# Patient Record
Sex: Female | Born: 1955
Health system: Southern US, Community
[De-identification: ages and names within clinical notes are randomized; demographics above are authoritative.]

## PROBLEM LIST (undated history)

## (undated) DIAGNOSIS — R768 Other specified abnormal immunological findings in serum: Secondary | ICD-10-CM

## (undated) DIAGNOSIS — D649 Anemia, unspecified: Secondary | ICD-10-CM

## (undated) DIAGNOSIS — H348122 Central retinal vein occlusion, left eye, stable: Secondary | ICD-10-CM

## (undated) DIAGNOSIS — I8290 Acute embolism and thrombosis of unspecified vein: Secondary | ICD-10-CM

## (undated) DIAGNOSIS — R7689 Other specified abnormal immunological findings in serum: Secondary | ICD-10-CM

## (undated) HISTORY — DX: Other specified abnormal immunological findings in serum: R76.8

## (undated) HISTORY — DX: Other specified abnormal immunological findings in serum: R76.89

## (undated) HISTORY — PX: COLONOSCOPY: SHX174

## (undated) HISTORY — DX: Central retinal vein occlusion, left eye, stable: H34.8122

## (undated) HISTORY — DX: Anemia, unspecified: D64.9

---
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
## 1988-03-14 DIAGNOSIS — H348122 Central retinal vein occlusion, left eye, stable: Secondary | ICD-10-CM

## 1988-03-14 HISTORY — DX: Central retinal vein occlusion, left eye, stable: H34.8122

## 1999-01-13 ENCOUNTER — Other Ambulatory Visit: Admission: RE | Admit: 1999-01-13 | Discharge: 1999-01-13 | Payer: Self-pay | Admitting: Gynecology

## 2000-01-31 ENCOUNTER — Other Ambulatory Visit: Admission: RE | Admit: 2000-01-31 | Discharge: 2000-01-31 | Payer: Self-pay | Admitting: Gynecology

## 2001-01-31 ENCOUNTER — Other Ambulatory Visit: Admission: RE | Admit: 2001-01-31 | Discharge: 2001-01-31 | Payer: Self-pay | Admitting: Gynecology

## 2002-03-14 HISTORY — PX: TUBAL LIGATION: SHX77

## 2002-04-24 ENCOUNTER — Other Ambulatory Visit: Admission: RE | Admit: 2002-04-24 | Discharge: 2002-04-24 | Payer: Self-pay | Admitting: Gynecology

## 2002-05-23 ENCOUNTER — Ambulatory Visit (HOSPITAL_COMMUNITY): Admission: RE | Admit: 2002-05-23 | Discharge: 2002-05-23 | Payer: Self-pay | Admitting: Gynecology

## 2002-05-23 ENCOUNTER — Encounter (INDEPENDENT_AMBULATORY_CARE_PROVIDER_SITE_OTHER): Payer: Self-pay

## 2003-03-15 HISTORY — PX: BLADDER SUSPENSION: SHX72

## 2003-03-15 HISTORY — PX: TOTAL VAGINAL HYSTERECTOMY: SHX2548

## 2003-03-25 ENCOUNTER — Encounter (INDEPENDENT_AMBULATORY_CARE_PROVIDER_SITE_OTHER): Payer: Self-pay | Admitting: Specialist

## 2003-03-25 ENCOUNTER — Observation Stay (HOSPITAL_COMMUNITY): Admission: RE | Admit: 2003-03-25 | Discharge: 2003-03-26 | Payer: Self-pay | Admitting: Gynecology

## 2005-05-12 ENCOUNTER — Other Ambulatory Visit: Admission: RE | Admit: 2005-05-12 | Discharge: 2005-05-12 | Payer: Self-pay | Admitting: Gynecology

## 2006-10-19 ENCOUNTER — Other Ambulatory Visit: Admission: RE | Admit: 2006-10-19 | Discharge: 2006-10-19 | Payer: Self-pay | Admitting: Gynecology

## 2007-11-07 ENCOUNTER — Encounter: Admission: RE | Admit: 2007-11-07 | Discharge: 2007-11-07 | Payer: Self-pay | Admitting: Internal Medicine

## 2008-03-19 ENCOUNTER — Ambulatory Visit: Payer: Self-pay | Admitting: Women's Health

## 2008-03-19 ENCOUNTER — Encounter: Payer: Self-pay | Admitting: Women's Health

## 2008-03-19 ENCOUNTER — Other Ambulatory Visit: Admission: RE | Admit: 2008-03-19 | Discharge: 2008-03-19 | Payer: Self-pay | Admitting: Gynecology

## 2008-04-23 ENCOUNTER — Ambulatory Visit: Payer: Self-pay | Admitting: Internal Medicine

## 2008-05-07 ENCOUNTER — Ambulatory Visit: Payer: Self-pay | Admitting: Internal Medicine

## 2009-02-24 ENCOUNTER — Ambulatory Visit: Payer: Self-pay | Admitting: Gynecology

## 2009-03-25 ENCOUNTER — Other Ambulatory Visit: Admission: RE | Admit: 2009-03-25 | Discharge: 2009-03-25 | Payer: Self-pay | Admitting: Gynecology

## 2009-03-25 ENCOUNTER — Ambulatory Visit: Payer: Self-pay | Admitting: Women's Health

## 2009-12-16 IMAGING — CR DG PELVIS 1-2V
1 series · 1 of 1 positions shown · non-contrast
Comparison: None

CLINICAL DATA: Right-sided pelvic pain.  Right hip pain.  No
injury.

PELVIS - 1-2 VIEW

[t pelvis a.p.]
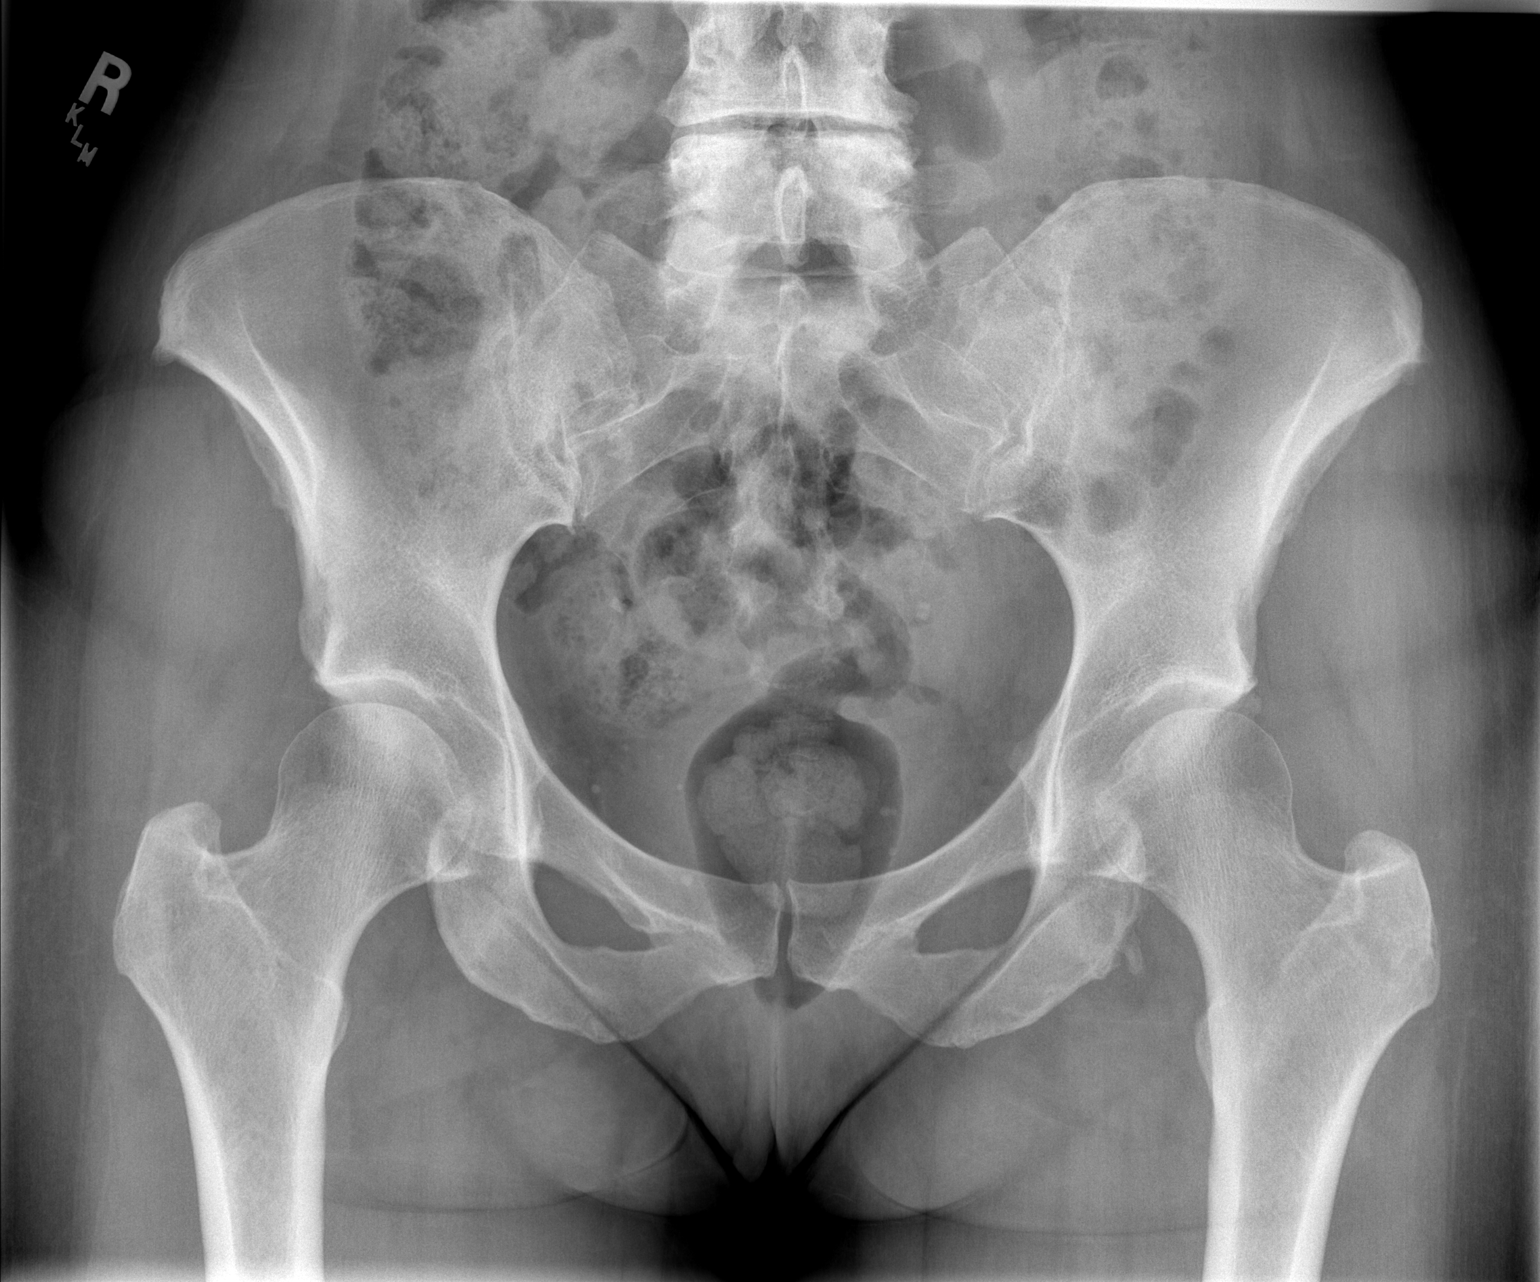

[1 of 1 positions shown; findings below may reference images not displayed]

FINDINGS: Moderate degenerative disc disease involves L4-L5.  The
sacroiliac joints are symmetric.  Both femoral heads are located.
Joint spaces are maintained.  Nonobstructive visualized bowel gas
pattern.  Phleboliths in the pelvis.
IMPRESSION: 1.  No acute findings about the pelvis.
2.  L4-L5 degenerative disc disease is moderate..

## 2010-05-19 ENCOUNTER — Encounter (INDEPENDENT_AMBULATORY_CARE_PROVIDER_SITE_OTHER): Payer: 59 | Admitting: Women's Health

## 2010-05-19 ENCOUNTER — Other Ambulatory Visit (HOSPITAL_COMMUNITY)
Admission: RE | Admit: 2010-05-19 | Discharge: 2010-05-19 | Disposition: A | Discharge status: Home / Self Care | Payer: 59 | Source: Ambulatory Visit | Attending: Gynecology | Admitting: Gynecology

## 2010-05-19 ENCOUNTER — Encounter: Payer: Self-pay | Admitting: Women's Health

## 2010-05-19 ENCOUNTER — Other Ambulatory Visit: Payer: Self-pay | Admitting: Women's Health

## 2010-05-19 DIAGNOSIS — Z124 Encounter for screening for malignant neoplasm of cervix: Secondary | ICD-10-CM | POA: Insufficient documentation

## 2010-05-19 DIAGNOSIS — R809 Proteinuria, unspecified: Secondary | ICD-10-CM

## 2010-05-19 DIAGNOSIS — Z13220 Encounter for screening for lipoid disorders: Secondary | ICD-10-CM

## 2010-05-19 DIAGNOSIS — Z833 Family history of diabetes mellitus: Secondary | ICD-10-CM

## 2010-05-19 DIAGNOSIS — Z01419 Encounter for gynecological examination (general) (routine) without abnormal findings: Secondary | ICD-10-CM

## 2010-05-19 LAB — HM PAP SMEAR: HM Pap smear: NORMAL

## 2010-07-06 ENCOUNTER — Encounter (INDEPENDENT_AMBULATORY_CARE_PROVIDER_SITE_OTHER): Payer: 59

## 2010-07-06 DIAGNOSIS — Z13820 Encounter for screening for osteoporosis: Secondary | ICD-10-CM

## 2010-07-09 ENCOUNTER — Other Ambulatory Visit (INDEPENDENT_AMBULATORY_CARE_PROVIDER_SITE_OTHER): Payer: 59

## 2010-07-09 DIAGNOSIS — E559 Vitamin D deficiency, unspecified: Secondary | ICD-10-CM

## 2010-07-22 ENCOUNTER — Other Ambulatory Visit: Payer: 59

## 2010-07-30 NOTE — Op Note (Signed)
NAME:  Jennifer [name], Jennifer [name]                      ACCOUNT NO.:  [id number]   MEDICAL RECORD NO.:  [id number]                   PATIENT TYPE:  AMB   LOCATION:  SDC                                  FACILITY:  [facility]   PHYSICIAN:  Timothy P. Fontaine, M.D.           DATE OF BIRTH:  [birth year]   DATE OF PROCEDURE:  05/23/2002  DATE OF DISCHARGE:                                 OPERATIVE REPORT   PREOPERATIVE DIAGNOSES:  Desires permanent sterilization.   POSTOPERATIVE DIAGNOSES:  1. Desires permanent sterilization.  2. Endometriosis.   PROCEDURE:  1. Laparoscopic bilateral tubal sterilization, Falope ring technique.  2. Biopsy of suspected endometriosis.   SURGEON:  Timothy P. Fontaine, M.D.   ANESTHESIA:  General.   COMPLICATIONS:  None.   ESTIMATED BLOOD LOSS:  Minimal.   SPECIMENS:  Peritoneal biopsy x2.   FINDINGS:  Anterior cul-de-sac normal.  Posterior cul-de-sac normal.  Uterus  grossly normal in size, shape, and contour.  Right and left ovaries grossly  normal, free, and mobile.  Right and left fallopian tubes normal length,  caliber with fimbriated ends.  On both tubes there was a single area of  brownish bleb like discoloration consistent with endometriosis.  Both areas  were biopsied off in their entirety.  The left fallopian tube area was noted  to have a brownish fluid extruded when biopsied and there was a very scant,  if negative tissue obtained with the biopsy, although the area was  eradicated with the biopsy.  There were no other areas of endometriosis  visualized in the pelvis.  The appendix was visualized and normal.  The  upper abdominal examination was grossly normal noting liver was smooth.  No  abnormalities.  Gallbladder normal to limited inspection.   PROCEDURE:  The patient was taken to the operating room.  Underwent general  endotracheal anesthesia.  Was placed in the low dorsal lithotomy position.  Receives an abdominal, perineal, vaginal preparation  with Betadine solution  by the nursing personnel and the bladder was emptied with in-and-out Foley  catheterization.  A Hulka tenaculum was then placed in the cervix after EUA  was performed and the patient was then draped in usual fashion.  A vertical  infraumbilical incision was made, the Veress needle placed, its position  verified with water and approximately 3.5 L of carbon dioxide gas was  infused.  The 10 mm laparoscopic trocar was then placed without difficulty,  its position verified visually.  The suprapubic Falope ring applying port  was then placed under direct visualization after transillumination for the  vessels without difficulty.  Examination of the pelvic organs was then  carried out with findings noted above.  The left fallopian tube was then  identified and traced from its insertion to its fimbriated end and a mid  tubal segment was then grasped, delivered into the Falope ring applying  device and a single Falope ring was applied.  A good knuckle  of tube was  within the ring and appropriate blanching occurred afterwards.  A similar  procedure was carried out on the other side.  Two discolored areas on both  fallopian tubes were identified suspicious for endometriosis and both areas  were superficially biopsied off.  There was scant bleeding from these areas  and no hemostasis was necessary.  The Falope ring areas were then  reinspected and again found to be intact with appropriate blanching and the  gas was then allowed to escape with removal of the suprapubic port and the  biopsy sites as well as the suprapubic port were inspected under a low  pressure situation showing adequate hemostasis.  The 10 mm port was then  backed out under direct visualization showing no evidence of hernia and  adequate hemostasis.  A 0 Vicryl interrupted subcutaneous fascial stitch was  placed infraumbilically.  Both skin incision sites were infiltrated using  0.25% Marcaine and subsequently  the skin was reapproximated using Dermabond  skin adhesive.  The patient was awakened without difficulty and taken to  recovery room in good condition having tolerated procedure well.  The Hulka  tenaculum was removed prior to her awakening.                                               Timothy P. Audie Box, M.D.    TPF/MEDQ  D:  05/23/2002  T:  05/23/2002  Job:  [id number]

## 2010-07-30 NOTE — Op Note (Signed)
NAME:  Jennifer [name], Jennifer [name]                      ACCOUNT NO.:  [id number]   MEDICAL RECORD NO.:  [id number]                   PATIENT TYPE:  OBV   LOCATION:  0452                                 FACILITY:  [facility]   PHYSICIAN:  Excell Seltzer. Annabell Howells, M.D.                 DATE OF BIRTH:  August 17, 1955   DATE OF PROCEDURE:  03/25/2003  DATE OF DISCHARGE:                                 OPERATIVE REPORT   PROCEDURE:  SPARC sling.   PREOPERATIVE DIAGNOSIS:  Stress incontinence.   POSTOPERATIVE DIAGNOSIS:  Stress incontinence.   SURGEON:  Excell Seltzer. Annabell Howells, M.D.   ANESTHESIA:  General.   DRAINS:  Foley catheter.   COMPLICATIONS:  None.   INDICATIONS:  Ms. [name] is a 55 year old white female, who is to undergo  a hysterectomy and posterior repair.  She has stress incontinence and has  elected to undergo SPARC sling.  She had previously been brought to the  operating room, placed under a general anesthetic and positioned in a dorsal  lithotomy position.  Hysterectomy had been performed vaginally by Dr.  Audie Box, and I entered to perform the [facility] sling.   Two 1 cm incisions were made at the level of the pubis 2 cm lateral to the  midline, one on each side.  The fat was spread to the fascia with a  hemostat, and a moistened sponge was placed over this area.  A Foley  catheter had been inserted previously.  The mid urethral level was  identified.  An incision was made in the anterior vaginal wall, midline,  overlying the mid urethra.  The mucosa was elevated off the pubourethral  fascia approximately 2 cm laterally on each side.  The [facility] trocars were  then passed, first through the right abdominal incision.  The tip was passed  through the fascia, down along the backside of the pubis until the tip could  be palpated by finger in the right side of the vaginal incision which was  also being used to hold the urethra away from the trocar.  Once the trocar  was passed on the right, this was repeated  on the left.  Cystoscopy was then  performed which revealed no evidence of injury to the bladder wall or  urethra.  The [facility] mesh was then snapped to the trocars and drawn back into  the abdominal incisions.  Cystoscopy was then repeated with the 22 [place]  scope and the 70 degree lens. Examination revealed an intact bladder wall.  No other bladder wall abnormalities.  The ureteral orifices were  unremarkable, and the mesh appeared to be in good position.  The bladder was  left full at this point, and the cystoscope was removed.  The sheaths were  removed from the mesh which was then tensioned appropriately, allowing  persistent flow of urine with pressure on the bladder.  At this point, the  anterior vaginal wall was closed with  a running lock 2-0 Vicryl  suture.  The abdominal incisions were closed with Dermabond.  The Foley  catheter was reinserted; the bladder was drained.  At this point, Dr.  Audie Box performed posterior repair.  There were no complications during my  portion of procedure.                                               Excell Seltzer. Annabell Howells, M.D.    JJW/MEDQ  D:  03/25/2003  T:  03/25/2003  Job:  [id number]

## 2010-07-30 NOTE — H&P (Signed)
NAME:  Jennifer [name], Jennifer [name]                      ACCOUNT NO.:  [id number]   MEDICAL RECORD NO.:  [id number]                   PATIENT TYPE:  AMB   LOCATION:  DAY                                  FACILITY:  [facility]   PHYSICIAN:  Timothy P. Fontaine, M.D.           DATE OF BIRTH:  [birth year]   DATE OF ADMISSION:  03/25/2003  DATE OF DISCHARGE:                                HISTORY & PHYSICAL   CHIEF COMPLAINT:  Uterine prolapse, rectocele, cystocele, stress urinary  incontinence.   HISTORY OF PRESENT ILLNESS:  [name] 55 year old G3, P2, AB 1 female with [name]  history of symptomatic uterine prolapse as well as stress incontinence for  TVH, posterior repair, urologic bladder suspension.  The patient has [name]  year's history of worsening symptoms, to include something protruding  through her vagina on [name] daily basis and urinary incontinence which has  become unacceptable.  She has been evaluated by Dr. Annabell Howells who feels she is  appropriate for [name] bladder procedure, and she is admitted for [facility], posterior  repair, and [name] bladder procedure.   PAST MEDICAL HISTORY:  1. History of antiphospholipid syndrome.  2. 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  She routinely takes [name] baby aspirin every day for this.   PAST SURGICAL HISTORY:  1. Cesarean section in 1993.  2. Laparoscopic tubal sterilization in 2004.   CURRENT MEDICATIONS:  1. Baby aspirin.  2. Sarafem 10 mg daily.   ALLERGIES:  CODEINE.   REVIEW OF SYSTEMS:  Noncontributory.   SOCIAL HISTORY:  Noncontributory.   FAMILY HISTORY:  Noncontributory.   PHYSICAL EXAMINATION:  VITAL SIGNS:  Afebrile, vital signs are stable.  HEENT:  Normal.  LUNGS:  Clear.  CARDIAC:  Regular rate without murmurs, rubs, or gallops.  ABDOMEN:  Benign.  PELVIC:  External BUS and vagina with cervix protruding through the  introitus.  Bimanual shows uterus to be grossly normal in size, nontender.  Adnexa without masses or  tenderness.  Rectovaginal exam confirms [name] mild  rectocele.   ASSESSMENT:  [name] 55 year old G3, P2, AB 1, female status post tubal  sterilization with worsening symptoms attributable to uterine prolapse as  well as stress urinary incontinence for total vaginal hysterectomy,  posterior repair, and bladder suspension.  The proposed surgery was reviewed  with her in detail and the expected intraoperative and postoperative course  was discussed.  She understands that both Dr. Annabell Howells and I will be performing  the surgeries, and that Dr. Annabell Howells will be in charge of her bladder and  postoperative bladder care.  I reviewed with her the issues associated with  her surgery, the first of which is absolute sterility.  She understands that  hysterectomy is an absolute irreversible sterility and understands and  accepts this.  Sexuality following hysterectomy was also discussed with her,  and the risks of orgasmic dysfunction as well as persistent dyspareunia was  reviewed,  understood, and accepted.  She understands particularly the  posterior repair and tightening the vagina that she may have difficulty or  discomfort with intercourse, and understands and accepts this.  The issues  of ovarian conservation was also discussed with her, and she prefers to keep  ovaries, although does give me permission to remove one or both if  significant disease is encountered.  She understands by keeping her ovaries  she is at risk for ovarian disease in the future, both benign and malignant,  and the risk of re-operation, and ovarian cancer was discussed with her.  The acute intraoperative risks were reviewed with her to include the risk of  infection requiring prolonged antibiotics, abscess formation requiring re-  operation and abscess drainage, and wound complications if indeed abdominal  incisions are made.  She understands that anytime during the procedure I may  need to switch to an abdominal approach.  She gives me  permission to do so.  The risk of bleeding leading to transfusion and the risk of transfusion were  discussed, to include hepatitis, HIV, mad cow disease, and other unknown  entities.  The risk of inadvertant injury to internal organs, including  bowel, bladder, ureters, vessels, and nerves necessitating major exploratory  reparative surgeries and future reparative surgeries, including ostomy  formation, was all discussed, understood, and accepted.  She understands we  are working closely with the bladder and the rectum, and that this are at  particular risk of injury, and she understands and accept this.  She does  have [name] history of an eye vessel thrombosis back in 1990, when she was on [name]  oral contraceptive felt to be secondary to an antiphospholipid syndrome.  I  will plan on prophylaxis both with Lovenox preoperatively as well as  intermittent compression stockings, and we will plan on continuing this  postoperatively.  She understands that she is at risk for thrombosis  associated with surgery and the risk of DVT and pulmonary embolus.  The  patient's questions were answered and she is ready to proceed with surgery.                                               Timothy P. Audie Box, M.D.    TPF/MEDQ  D:  03/23/2003  T:  03/23/2003  Job:  [id number]

## 2010-07-30 NOTE — Op Note (Signed)
NAME:  Jennifer [name], Jennifer [name]                      ACCOUNT NO.:  [id number]   MEDICAL RECORD NO.:  [id number]                   PATIENT TYPE:  AMB   LOCATION:  DAY                                  FACILITY:  [facility]   PHYSICIAN:  Timothy P. Fontaine, M.D.           DATE OF BIRTH:  19-Sep-1955   DATE OF PROCEDURE:  03/25/2003  DATE OF DISCHARGE:                                 OPERATIVE REPORT   PREOPERATIVE DIAGNOSES:  1. Uterine prolapse.  2. Rectocele.  3. Stress urinary incontinence.   POSTOPERATIVE DIAGNOSES:  1. Uterine prolapse.  2. Rectocele.  3. Stress urinary incontinence.   OPERATION/PROCEDURE:  Gynecologic:  1. Total vaginal hysterectomy.  2. Posterior colporrhaphy.  Urologic:  SPARC sling.   SURGEON:  Timothy P. Fontaine, M.D.  Daniel L. Eda Paschal, M.D.   ANESTHESIA:  General endotracheal anesthesia.   COMPLICATIONS:  None.   ESTIMATED BLOOD LOSS:  50 mL.   FINDINGS:  Uterus is overall normal in size.  Fibrous adhesions noted over  fundal posterior surface.  No gross evidence of endometriosis or other  pathology.  Right and left fallopian tube segments grossly normal.  Right  and left ovaries grossly normal.  Cul-de-sac grossly normal to inspection.   DESCRIPTION OF PROCEDURE:  The patient was taken to the operating room,  underwent general endotracheal anesthesia, was placed in the dorsal  lithotomy position, received [name] lower abdominal perineal vaginal preparation  Betadine solution.  Bladder emptied with Foley catheterization.  EUA  performed.  The patient was draped in the usual fashion.  Cervix was  visualized, grasped with [name] tenaculum and circumferentially injected using  lidocaine and epinephrine mixture, approximately 10 mL total.   The cervical mucosa was then circumferentially incised and the paracervical  plane sharply developed.  The posterior cul-de-sac was then entered, [name] long  weighted speculum placed and the right and left uterosacral ligaments  were  clamped, cut and ligated using 0 Vicryl suture and tagged for future  reference.  The anterior cul-de-sac was subsequently sharply developed and  subsequently entered and the uterus was then freed from its attachments  through progressive clamping, cutting and ligating of the parametrial  tissues using 0 Vicryl suture.  The uterus was delivered through the vagina.  The utero-ovarian pedicles were then doubly clamped and the uterus was  excised.  The utero-ovarian pedicles were bilaterally ligated using 0 Vicryl  suture, first in [name] simple stitch followed by [name] suture ligature.  The  pedicles were inspected.  Adequate hemostasis was visualized.  The long  weighted speculum was then replaced with [name] short weighted speculum.  The  posterior vaginal cuff was grasped with an Allis clamp and the intestines  were packed with [name] sponge from the operative field.  The posterior vaginal  cuff was then run from uterosacral ligament to uterosacral ligament using 0  Vicryl suture in [name] running, interlocking stitch.  The packing was then  removed.  Adequate  hemostasis was visualized and the vagina was closed  anterior to posterior using 0 Vicryl suture interrupted figure-of-eight  stitch.   Dr. Annabell Howells was then notified and presented to complete his part of the  procedure to do the [facility] sling.   Following his procedure, the posterior colporrhaphy was initiated.  The  vaginal mucosa was grasped with Allis clamps and initial transverse incision  was made at the level of the hymenal ring.  The vaginal mucosa was then  progressively sharply incised inferior to superior approximately two  fingerbreadths below the vaginal cuff.  The pararectal planes were then  sharply and bluntly developed bilaterally and the rectocele was reduced  through progressive interrupted 2-0 Vicryl sutures, reapproximating the  pararectal fascia.  The excess vaginal mucosa was then trimmed.  Rectal exam  performed and showed good  support.  No evidence of injury to the rectum.  The excess vaginal mucosa was trimmed.  The vaginal mucosa was then run  using 2-0 Vicryl suture in [name] running interlocking stitch closing the dead  space beneath it.  Foley catheter was then placed and free-flowing clear  yellow urine noted.  The patient was then placed in the supine position  after packing of the vagina with two-inch gauze with Estrace cream.                                               Timothy P. Audie Box, M.D.    TPF/MEDQ  D:  03/25/2003  T:  03/25/2003  Job:  [id number]

## 2010-07-30 NOTE — H&P (Signed)
NAME:  Jennifer [name], TRIBBEY                      ACCOUNT NO.:  [id number]   MEDICAL RECORD NO.:  [id number]                   PATIENT TYPE:  AMB   LOCATION:  SDC                                  FACILITY:  [facility]   PHYSICIAN:  Timothy P. Fontaine, M.D.           DATE OF BIRTH:  09/11/1955   DATE OF ADMISSION:  05/23/2002  DATE OF DISCHARGE:                                HISTORY & PHYSICAL   CHIEF COMPLAINT:  Sterilization.   HISTORY OF PRESENT ILLNESS:  [age]-year-old G3, P2, AB 1 female who  desires permanent sterilization.  The patient has been counseled for  alternative forms of contraception to include reversible and she has elected  for the sterilization.   PAST MEDICAL HISTORY:  Significant for an ocular vascular thrombosis in 1990  for which she currently takes baby aspirin and a positive ANA factor for  which she has no other symptoms.   PAST SURGICAL HISTORY:  Includes a cesarean section.   CURRENT MEDICATIONS:  Baby aspirin, Sarafem.   REVIEW OF SYSTEMS:  Noncontributory.   SOCIAL HISTORY:  Noncontributory.   FAMILY HISTORY:  Noncontributory.   PHYSICAL EXAMINATION:  VITAL SIGNS:  Afebrile.  Vital signs stable.  HEENT:  Normal.  LUNGS:  Clear.  CARDIAC:  Regular rate.  Nor rubs, murmurs or gallops.  ABDOMEN:  Benign.  PELVIC:  External BUS, vagina normal.  Cervix normal.  Uterus normal size,  nontender.  Adnexa without masses or tenderness.   ASSESSMENT:  A 55 year old G3, P2, AB 1 female who desires permanent  sterilization.  I reviewed with the patient the proposed laparoscopic tubal  sterilization.  I discussed the permanency of the procedure as well as the  potential for failure.  I reviewed what is involved including the  intraoperative and postoperative courses.  I discussed instrumentation,  insufflation, trocar placement, use of Falope rings and bipolar cautery  backup.  The risks were reviewed including inadvertent injury to internal  organs,  including bowel, bladder, ureters, vessels and nerves necessitating  the nature exploratory repair to surgery and future reparative surgeries  including ostomy formation.  Risks of vascular injury and hemorrhage were  reviewed and the risk of transfusion discussed to include transfusion  reaction, hepatitis, HIV, mad cow disease and other unknown entities.  The  risk of infection both internal requiring  reoperation or antibiotics as well as incisional requiring opening and  draining of incisions and antibiotic therapy was all discussed.  The  permanency again was stressed as well as potential for failure.  She  understands and accepts this.  The patient's questions were answered to her  satisfaction.  She is ready to proceed with surgery.  Timothy P. Audie Box, M.D.    TPF/MEDQ  D:  05/22/2002  T:  05/22/2002  Job:  [id number]

## 2011-06-22 ENCOUNTER — Encounter: Payer: Self-pay | Admitting: *Deleted

## 2011-06-22 DIAGNOSIS — R768 Other specified abnormal immunological findings in serum: Secondary | ICD-10-CM | POA: Insufficient documentation

## 2011-06-23 ENCOUNTER — Encounter: Payer: Self-pay | Admitting: Women's Health

## 2011-06-23 ENCOUNTER — Ambulatory Visit (INDEPENDENT_AMBULATORY_CARE_PROVIDER_SITE_OTHER): Payer: 59 | Admitting: Women's Health

## 2011-06-23 VITALS — BP 120/80 | Ht 65.5 in | Wt 140.0 lb

## 2011-06-23 DIAGNOSIS — Z01419 Encounter for gynecological examination (general) (routine) without abnormal findings: Secondary | ICD-10-CM

## 2011-06-23 DIAGNOSIS — E079 Disorder of thyroid, unspecified: Secondary | ICD-10-CM

## 2011-06-23 DIAGNOSIS — Z833 Family history of diabetes mellitus: Secondary | ICD-10-CM

## 2011-06-23 DIAGNOSIS — R350 Frequency of micturition: Secondary | ICD-10-CM

## 2011-06-23 LAB — CBC WITH DIFFERENTIAL/PLATELET
Basophils Absolute: 0.1 10*3/uL (ref 0.0–0.1)
Basophils Relative: 2 % — ABNORMAL HIGH (ref 0–1)
MCHC: 31.8 g/dL (ref 30.0–36.0)
Monocytes Absolute: 0.4 10*3/uL (ref 0.1–1.0)
Neutro Abs: 2.8 10*3/uL (ref 1.7–7.7)
Neutrophils Relative %: 53 % (ref 43–77)
Platelets: 312 10*3/uL (ref 150–400)
RDW: 12.9 % (ref 11.5–15.5)

## 2011-06-23 LAB — URINALYSIS W MICROSCOPIC + REFLEX CULTURE
Hgb urine dipstick: NEGATIVE
Leukocytes, UA: NEGATIVE
Nitrite: NEGATIVE
Protein, ur: NEGATIVE mg/dL

## 2011-06-23 LAB — GLUCOSE, RANDOM: Glucose, Bld: 89 mg/dL (ref 70–99)

## 2011-06-23 LAB — TSH: TSH: 2.065 u[IU]/mL (ref 0.350–4.500)

## 2011-06-23 MED ORDER — ALPRAZOLAM 0.25 MG PO TABS: 0.2500 mg | ORAL_TABLET | Freq: Every evening | ORAL | Status: DC | PRN

## 2011-06-23 NOTE — Patient Instructions (Signed)

## 2011-06-23 NOTE — Progress Notes (Signed)
Jennifer [name] [birth year] [id number]    History:    The patient presents for annual exam.  Postmenopausal on no ERT. History of positive ANA factor. Normal DEXA/2012 . Normal colonoscopy/2010. History of normal Paps and mammograms. History of a TVH with posterior repair with a sling in 2005. Has urinary frequency with no pain or other problems. Excellent lipid profile in 2012.   Past medical history, past surgical history, family history and social history were all reviewed and documented in the EPIC chart. Works in Community education officer. Her daughter Jennifer [name] age 38 doing well at [employer].  Son Jennifer [name] 16 doing well. Boyfriends 2 children causing havic, 24 year old son may not graduate high school, daughter is 81 and making bad choices (mother died of breast ca).   ROS:  A  ROS was performed and pertinent positives and negatives are included in the history.  Exam:  Filed Vitals:   06/23/11 0905  BP: 120/80    General appearance:  Normal Head/Neck:  Normal, without cervical or supraclavicular adenopathy. Thyroid:  Symmetrical, normal in size, without palpable masses or nodularity. Respiratory  Effort:  Normal  Auscultation:  Clear without wheezing or rhonchi Cardiovascular  Auscultation:  Regular rate, without rubs, murmurs or gallops  Edema/varicosities:  Not grossly evident Abdominal  Soft,nontender, without masses, guarding or rebound.  Liver/spleen:  No organomegaly noted  Hernia:  None appreciated  Skin  Inspection:  Grossly normal  Palpation:  Grossly normal Neurologic/psychiatric  Orientation:  Normal with appropriate conversation.  Mood/affect:  Normal  Genitourinary    Breasts: Examined lying and sitting.     Right: Without masses, retractions, discharge or axillary adenopathy.     Left: Without masses, retractions, discharge or axillary adenopathy.   Inguinal/mons:  Normal without inguinal adenopathy  External genitalia:  Normal  BUS/Urethra/Skene's glands:  Normal  Bladder:   Normal  Vagina:  Normal  Cervix:  Absent  Uterus:  Absent  Adnexa/parametria:     Rt: Without masses or tenderness.   Lt: Without masses or tenderness.  Anus and perineum: Normal  Digital rectal exam: Normal sphincter tone without palpated masses or tenderness  Assessment/Plan:  56 y.o. DWF G3 P2 for annual exam with complaint of increased anxiety related to situational stressors of boyfriends teenage children.   Normal postmenopausal exam Situational stressors  Plan: Continues with some menopausal symptoms, again reviewed no ERT due to history of positive ANA. Had tried Effexor in the past but had nausea and generally did not feel well on. Encouraged vaginal lubricants, exercise, vitamin E..  Jennifer [name]'s counseling card given instructed to schedule counseling for herself and boyfriend to help deal with children issues. Xanax .25 prescription, given and reviewed is aware to use sparingly that it is addictive. SBE's, annual mammogram, calcium rich diet, vitamin D 2000 daily encouraged. CBC, glucose, TSH, UA.   Jennifer [name] WHNP, 12:04 PM 06/23/2011

## 2011-07-20 ENCOUNTER — Encounter: Payer: Self-pay | Admitting: Women's Health

## 2011-08-18 ENCOUNTER — Ambulatory Visit (INDEPENDENT_AMBULATORY_CARE_PROVIDER_SITE_OTHER): Payer: 59 | Admitting: Licensed Clinical Social Worker

## 2011-08-18 DIAGNOSIS — F321 Major depressive disorder, single episode, moderate: Secondary | ICD-10-CM

## 2011-08-19 ENCOUNTER — Other Ambulatory Visit: Payer: Self-pay | Admitting: Women's Health

## 2011-08-19 DIAGNOSIS — F329 Major depressive disorder, single episode, unspecified: Secondary | ICD-10-CM

## 2011-08-19 MED ORDER — CITALOPRAM HYDROBROMIDE 10 MG PO TABS: 10.0000 mg | ORAL_TABLET | Freq: Every day | ORAL | Status: DC

## 2011-08-19 NOTE — Progress Notes (Signed)
Telephone call from Berniece Andreas counselor. Reviewed has seen Samara Deist for counseling and recommend Celexa 10 mg daily for depression.  Telephone call to Darel Hong, reviewed have spoken to [place] counselor and recommend Celexa 10 mg daily patient states would like to try prescription called in will start we'll continue counseling reviewed may need to increase dose to 20 mg per day. Having increased situational stress with boyfriend's son. Denies any feelings of harming self.

## 2011-09-01 ENCOUNTER — Ambulatory Visit: Payer: 59 | Admitting: Licensed Clinical Social Worker

## 2011-09-08 ENCOUNTER — Ambulatory Visit (INDEPENDENT_AMBULATORY_CARE_PROVIDER_SITE_OTHER): Payer: 59 | Admitting: Licensed Clinical Social Worker

## 2011-09-08 DIAGNOSIS — F321 Major depressive disorder, single episode, moderate: Secondary | ICD-10-CM

## 2011-09-29 ENCOUNTER — Ambulatory Visit (INDEPENDENT_AMBULATORY_CARE_PROVIDER_SITE_OTHER): Payer: 59 | Admitting: Licensed Clinical Social Worker

## 2011-09-29 DIAGNOSIS — F321 Major depressive disorder, single episode, moderate: Secondary | ICD-10-CM

## 2011-11-10 ENCOUNTER — Ambulatory Visit: Payer: 59 | Admitting: Licensed Clinical Social Worker

## 2012-03-15 ENCOUNTER — Other Ambulatory Visit: Payer: Self-pay | Admitting: Women's Health

## 2012-03-15 ENCOUNTER — Telehealth: Payer: Self-pay | Admitting: *Deleted

## 2012-03-15 DIAGNOSIS — F329 Major depressive disorder, single episode, unspecified: Secondary | ICD-10-CM

## 2012-03-15 MED ORDER — ALPRAZOLAM 0.25 MG PO TABS: 0.2500 mg | ORAL_TABLET | Freq: Every evening | ORAL | Status: DC | PRN

## 2012-03-15 MED ORDER — CITALOPRAM HYDROBROMIDE 10 MG PO TABS: 10.0000 mg | ORAL_TABLET | Freq: Every day | ORAL | Status: DC

## 2012-03-15 NOTE — Telephone Encounter (Signed)
Pt called requesting refills on xanax 0.25 and celexa 10 mg. Please advise, refills?

## 2012-03-15 NOTE — Telephone Encounter (Signed)
Please call in Celexa 10 mg daily #30 with 4 refills. Her annual is due in April. Also call in Xanax 0.25 every 8 hours as needed #30 with 1 refill. Thanks

## 2012-03-15 NOTE — Telephone Encounter (Signed)
Rx's phoned into pharmacy. 

## 2012-03-28 ENCOUNTER — Ambulatory Visit (INDEPENDENT_AMBULATORY_CARE_PROVIDER_SITE_OTHER): Payer: 59 | Admitting: Gynecology

## 2012-03-28 DIAGNOSIS — Z23 Encounter for immunization: Secondary | ICD-10-CM

## 2012-05-16 ENCOUNTER — Telehealth: Payer: Self-pay | Admitting: *Deleted

## 2012-05-16 MED ORDER — ALPRAZOLAM 0.25 MG PO TABS: 0.2500 mg | ORAL_TABLET | Freq: Every evening | ORAL | Status: DC | PRN

## 2012-05-16 NOTE — Telephone Encounter (Signed)
Pt calling requesting refill on xanax 0.24mh. Okay to fill?

## 2012-05-16 NOTE — Telephone Encounter (Signed)
Jennifer [name], on last note it says Jennifer [name] it meant to yes, sorry

## 2012-05-16 NOTE — Telephone Encounter (Signed)
Yaz, please call in Xanax 0.25 every 8 hours when necessary #30 with 1 refill. Review addictive properties, use sparingly. thanks

## 2012-05-16 NOTE — Telephone Encounter (Signed)
Pt inform, rx sent.

## 2012-06-07 ENCOUNTER — Encounter: Payer: Self-pay | Admitting: Women's Health

## 2012-06-07 ENCOUNTER — Ambulatory Visit (INDEPENDENT_AMBULATORY_CARE_PROVIDER_SITE_OTHER): Payer: 59 | Admitting: Women's Health

## 2012-06-07 DIAGNOSIS — N390 Urinary tract infection, site not specified: Secondary | ICD-10-CM | POA: Insufficient documentation

## 2012-06-07 DIAGNOSIS — R350 Frequency of micturition: Secondary | ICD-10-CM

## 2012-06-07 LAB — URINALYSIS W MICROSCOPIC + REFLEX CULTURE
Casts: NONE SEEN
Nitrite: NEGATIVE
pH: 5.5 (ref 5.0–8.0)

## 2012-06-07 MED ORDER — SULFAMETHOXAZOLE-TRIMETHOPRIM 800-160 MG PO TABS: 1.0000 | ORAL_TABLET | Freq: Two times a day (BID) | ORAL | Status: DC

## 2012-06-07 NOTE — Patient Instructions (Addendum)
Urinary Tract Infection Urinary tract infections (UTIs) can develop anywhere along your urinary tract. Your urinary tract is your body's drainage system for removing wastes and extra water. Your urinary tract includes two kidneys, two ureters, a bladder, and a urethra. Your kidneys are a pair of bean-shaped organs. Each kidney is about the size of your fist. They are located below your ribs, one on each side of your spine. CAUSES Infections are caused by microbes, which are microscopic organisms, including fungi, viruses, and bacteria. These organisms are so small that they can only be seen through a microscope. Bacteria are the microbes that most commonly cause UTIs. SYMPTOMS  Symptoms of UTIs may vary by age and gender of the patient and by the location of the infection. Symptoms in Augusten Lipkin women typically include a frequent and intense urge to urinate and a painful, burning feeling in the bladder or urethra during urination. Older women and men are more likely to be tired, shaky, and weak and have muscle aches and abdominal pain. A fever may mean the infection is in your kidneys. Other symptoms of a kidney infection include pain in your back or sides below the ribs, nausea, and vomiting. DIAGNOSIS To diagnose a UTI, your caregiver will ask you about your symptoms. Your caregiver also will ask to provide a urine sample. The urine sample will be tested for bacteria and white blood cells. White blood cells are made by your body to help fight infection. TREATMENT  Typically, UTIs can be treated with medication. Because most UTIs are caused by a bacterial infection, they usually can be treated with the use of antibiotics. The choice of antibiotic and length of treatment depend on your symptoms and the type of bacteria causing your infection. HOME CARE INSTRUCTIONS  If you were prescribed antibiotics, take them exactly as your caregiver instructs you. Finish the medication even if you feel better after you  have only taken some of the medication.  Drink enough water and fluids to keep your urine clear or pale yellow.  Avoid caffeine, tea, and carbonated beverages. They tend to irritate your bladder.  Empty your bladder often. Avoid holding urine for long periods of time.  Empty your bladder before and after sexual intercourse.  After a bowel movement, women should cleanse from front to back. Use each tissue only once. SEEK MEDICAL CARE IF:   You have back pain.  You develop a fever.  Your symptoms do not begin to resolve within 3 days. SEEK IMMEDIATE MEDICAL CARE IF:   You have severe back pain or lower abdominal pain.  You develop chills.  You have nausea or vomiting.  You have continued burning or discomfort with urination. MAKE SURE YOU:   Understand these instructions.  Will watch your condition.  Will get help right away if you are not doing well or get worse. Document Released: 12/08/2004 Document Revised: 08/30/2011 Document Reviewed: 04/08/2011 ExitCare Patient Information 2013 [employer].  

## 2012-06-07 NOTE — Progress Notes (Signed)
Patient ID: ERNESTENE COOVER, female   DOB: January 19, 1956, 57 y.o.   MRN: [id number] Presents with complaint of increased urinary frequency with burning and discomfort at end of stream. Symptoms started last week and had 2 left over Cipro that she took symptoms subsided but now have returned. Denies any vaginal discharge, abdominal pain or fever.   Exam: appears well. No CVAT. UA: Moderate blood, moderate leukocytes, 21-50 WBCs, and 21-50 RBCs, many bacteria.  UTI  Plan: Septra DS one by mouth twice daily for 3 days #6 prescription, proper use given and reviewed. UTI prevention discussed. Instructed to call if no relief of symptoms. Urine culture pending.

## 2012-06-10 LAB — URINE CULTURE: Colony Count: >=100000

## 2012-06-12 ENCOUNTER — Telehealth: Payer: Self-pay | Admitting: *Deleted

## 2012-06-12 MED ORDER — CIPROFLOXACIN HCL 250 MG PO TABS: 250.0000 mg | ORAL_TABLET | Freq: Two times a day (BID) | ORAL | Status: DC

## 2012-06-12 NOTE — Telephone Encounter (Signed)
You are back up MD, pt was given Septra DS one by mouth twice daily for 3 days #6 on 06/07/12, took all rx and noticed last night urgency and light burning with urination. She asked if another rx could be given? Please advise

## 2012-06-12 NOTE — Telephone Encounter (Signed)
Pt informed with the below note. 

## 2012-06-12 NOTE — Telephone Encounter (Signed)
Ciprofloxacin 250 mg twice a day x7 days, order placed

## 2012-06-27 ENCOUNTER — Encounter: Payer: 59 | Admitting: Women's Health

## 2012-07-18 ENCOUNTER — Ambulatory Visit (INDEPENDENT_AMBULATORY_CARE_PROVIDER_SITE_OTHER): Payer: 59 | Admitting: Women's Health

## 2012-07-18 ENCOUNTER — Encounter: Payer: Self-pay | Admitting: Women's Health

## 2012-07-18 VITALS — BP 106/66 | Ht 65.5 in | Wt 137.0 lb

## 2012-07-18 DIAGNOSIS — R7689 Other specified abnormal immunological findings in serum: Secondary | ICD-10-CM

## 2012-07-18 DIAGNOSIS — F32A Depression, unspecified: Secondary | ICD-10-CM

## 2012-07-18 DIAGNOSIS — R768 Other specified abnormal immunological findings in serum: Secondary | ICD-10-CM

## 2012-07-18 DIAGNOSIS — R894 Abnormal immunological findings in specimens from other organs, systems and tissues: Secondary | ICD-10-CM

## 2012-07-18 DIAGNOSIS — Z01419 Encounter for gynecological examination (general) (routine) without abnormal findings: Secondary | ICD-10-CM

## 2012-07-18 DIAGNOSIS — Z833 Family history of diabetes mellitus: Secondary | ICD-10-CM

## 2012-07-18 DIAGNOSIS — Z13220 Encounter for screening for lipoid disorders: Secondary | ICD-10-CM

## 2012-07-18 DIAGNOSIS — F329 Major depressive disorder, single episode, unspecified: Secondary | ICD-10-CM

## 2012-07-18 LAB — CBC WITH DIFFERENTIAL/PLATELET
Basophils Absolute: 0.1 10*3/uL (ref 0.0–0.1)
Basophils Relative: 1 % (ref 0–1)
Eosinophils Absolute: 0.3 10*3/uL (ref 0.0–0.7)
Lymphs Abs: 2.2 10*3/uL (ref 0.7–4.0)
MCH: 30.7 pg (ref 26.0–34.0)
MCHC: 34 g/dL (ref 30.0–36.0)
Neutrophils Relative %: 50 % (ref 43–77)
Platelets: 315 10*3/uL (ref 150–400)
RBC: 4.66 MIL/uL (ref 3.87–5.11)
RDW: 13.2 % (ref 11.5–15.5)

## 2012-07-18 LAB — LIPID PANEL
Cholesterol: 133 mg/dL (ref 0–200)
HDL: 55 mg/dL (ref >39)
Total CHOL/HDL Ratio: 2.4 Ratio

## 2012-07-18 LAB — GLUCOSE, RANDOM: Glucose, Bld: 91 mg/dL (ref 70–99)

## 2012-07-18 MED ORDER — CITALOPRAM HYDROBROMIDE 10 MG PO TABS: 10.0000 mg | ORAL_TABLET | Freq: Every day | ORAL | Status: DC

## 2012-07-18 MED ORDER — ALPRAZOLAM 0.25 MG PO TABS: 0.2500 mg | ORAL_TABLET | Freq: Every evening | ORAL | Status: DC | PRN

## 2012-07-18 NOTE — Patient Instructions (Addendum)

## 2012-07-18 NOTE — Progress Notes (Signed)
LATRESE CAROLAN 1956-01-27 [id number]    History:    The patient presents for annual exam with no complaints. TVH with bladder sling/repair 2005.  Colonoscopy normal 2010. Normal pap and mammogram history. Normal DEXA 2012 T- score -0.4 at left femoral neck. Positive ANA factor, history of thrombosis of the eye in 1990/baby aspirin daily. Has seen a counselor for situational anxiety and depression is currently on Celexa 10 mg and doing well. Uses a rare Xanax 0.25 for rest   Past medical history, past surgical history, family history and social history were all reviewed and documented in the EPIC chart. 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 Father- Heart disease and HTN.   ROS:  A  ROS was performed and pertinent positives and negatives are included in the history.  Exam:  Filed Vitals:   07/18/12 1212  BP: 106/66    General appearance:  Normal Head/Neck:  Normal, without cervical or supraclavicular adenopathy. Thyroid:  Symmetrical, normal in size, without palpable masses or nodularity. Respiratory  Effort:  Normal  Auscultation:  Clear without wheezing or rhonchi Cardiovascular  Auscultation:  Regular rate, without rubs, murmurs or gallops  Edema/varicosities:  Not grossly evident Abdominal  Soft,nontender, without masses, guarding or rebound.  Liver/spleen:  No organomegaly noted  Hernia:  None appreciated  Skin  Inspection:  Grossly normal  Palpation:  Grossly normal Neurologic/psychiatric  Orientation:  Normal with appropriate conversation.  Mood/affect:  Normal  Genitourinary    Breasts: Examined lying and sitting.     Right: Without masses, retractions, discharge or axillary adenopathy.     Left: Without masses, retractions, discharge or axillary adenopathy.   Inguinal/mons:  Normal without inguinal adenopathy  External genitalia:  Normal  BUS/Urethra/Skene's glands:  Normal  Bladder:  Normal  Vagina:  Normal  Cervix: absent  Uterus:   absent Adnexa/parametria:     Rt: Without masses or tenderness.   Lt: Without masses or tenderness.  Anus and perineum: Normal  Digital rectal exam: Normal sphincter tone without palpated masses or tenderness  Assessment/Plan:  57 y.o. DWF G2P2  for annual exam with no complaints.  Normal GYN exam- hysterectomy Depression stable   Plan: CBC, glucose, lipid panel, UA. Home Hemoccult card given. Celexa 10mg  daily. Xanax 0.25 mg at bedtime prn. Prescription, proper use given and reviewed. Reviewed addictive properties of Xanax. Continue counseling as needed.  SBE's, calcium rich diet, vitamin D 2000 daily, annual mammograms.  Encouraged healthy diet and exercise. And the safety and fall prevention discussed. Repeat DEXA in another year.  Harrington Challenger WHNP, 1:17 PM 07/18/2012

## 2012-07-19 ENCOUNTER — Encounter: Payer: Self-pay | Admitting: Gynecology

## 2012-07-19 LAB — URINALYSIS W MICROSCOPIC + REFLEX CULTURE
Hgb urine dipstick: NEGATIVE
Leukocytes, UA: NEGATIVE
Nitrite: NEGATIVE
Protein, ur: NEGATIVE mg/dL
Urobilinogen, UA: 0.2 mg/dL (ref 0.0–1.0)

## 2012-07-31 ENCOUNTER — Encounter: Payer: Self-pay | Admitting: Women's Health

## 2012-08-16 ENCOUNTER — Other Ambulatory Visit: Payer: Self-pay

## 2012-08-16 DIAGNOSIS — F329 Major depressive disorder, single episode, unspecified: Secondary | ICD-10-CM

## 2012-08-16 MED ORDER — CITALOPRAM HYDROBROMIDE 10 MG PO TABS: 10.0000 mg | ORAL_TABLET | Freq: Every day | ORAL | Status: DC

## 2012-09-10 ENCOUNTER — Other Ambulatory Visit: Payer: Self-pay | Admitting: Anesthesiology

## 2012-09-10 DIAGNOSIS — Z1211 Encounter for screening for malignant neoplasm of colon: Secondary | ICD-10-CM

## 2012-12-06 ENCOUNTER — Telehealth: Payer: Self-pay | Admitting: *Deleted

## 2012-12-06 DIAGNOSIS — F329 Major depressive disorder, single episode, unspecified: Secondary | ICD-10-CM

## 2012-12-06 MED ORDER — ALPRAZOLAM 0.25 MG PO TABS: 0.2500 mg | ORAL_TABLET | Freq: Every evening | ORAL | Status: DC | PRN

## 2012-12-06 NOTE — Telephone Encounter (Signed)
rx called in, pt informed. 

## 2012-12-06 NOTE — Telephone Encounter (Signed)
Pt calling requesting refill on xanax 0.25 mg. Pt has annual in May. Please advise

## 2012-12-06 NOTE — Telephone Encounter (Signed)
Ok et call in with 1 refill.

## 2013-01-03 ENCOUNTER — Encounter: Payer: Self-pay | Admitting: Women's Health

## 2013-01-03 ENCOUNTER — Ambulatory Visit (INDEPENDENT_AMBULATORY_CARE_PROVIDER_SITE_OTHER): Payer: 59 | Admitting: Women's Health

## 2013-01-03 ENCOUNTER — Ambulatory Visit (INDEPENDENT_AMBULATORY_CARE_PROVIDER_SITE_OTHER): Payer: 59 | Admitting: Anesthesiology

## 2013-01-03 DIAGNOSIS — Z23 Encounter for immunization: Secondary | ICD-10-CM

## 2013-01-03 DIAGNOSIS — Z7189 Other specified counseling: Secondary | ICD-10-CM

## 2013-01-03 NOTE — Progress Notes (Signed)
Patient ID: Jennifer [name], female   DOB: Sep 20, 1955, 57 y.o.   MRN: [id number] Presents to discuss shingles/ zostavac vaccine. Mother-in-law with severe case of shingles,  questions if she should receive. Reviewed insurance may cover  but usually always covers after age 100. Would be best if she received now due to possible exposure to prevent. Continue annual flu vaccine. Pneumovax at age 38.  Instructed may get zostavac at any pharmacy that gives immunizations.

## 2013-01-31 ENCOUNTER — Other Ambulatory Visit: Payer: Self-pay | Admitting: Women's Health

## 2013-01-31 ENCOUNTER — Telehealth: Payer: Self-pay

## 2013-01-31 DIAGNOSIS — F329 Major depressive disorder, single episode, unspecified: Secondary | ICD-10-CM

## 2013-01-31 MED ORDER — ALPRAZOLAM 0.25 MG PO TABS: 0.2500 mg | ORAL_TABLET | Freq: Every evening | ORAL | Status: DC | PRN

## 2013-01-31 NOTE — Telephone Encounter (Signed)
Rx called in to pharmacy. 

## 2013-01-31 NOTE — Telephone Encounter (Signed)
Sorry, thought it was a refill, xanax .25 hs prn  #90 with no refills.

## 2013-01-31 NOTE — Telephone Encounter (Signed)
Patient called asking if you would refill her Xanax but she would like to get several mos worth at once so that she doesn't have to keep refilling it every month.

## 2013-01-31 NOTE — Telephone Encounter (Signed)
Harriett Sine, would should directions be on the #90 Xanax?

## 2013-01-31 NOTE — Telephone Encounter (Signed)
Telephone call, states has had some added stress, son is doing cocaine College but has to remind him to complete a Darrol Angel. Daughter senior in [place] doing well but is worried about jobs. States has trouble sleeping, Xanax helps. Reviewed addictive to use sparingly. States never takes except at bed time. Will call in #90 with no refills.  Please E. scribe 90 with no refills.

## 2013-01-31 NOTE — Telephone Encounter (Signed)
I spoke with Jennifer [name] and correction below should read that son is doing OKAY in college and she has to remind him to do assignments.

## 2013-03-25 ENCOUNTER — Other Ambulatory Visit: Payer: Self-pay | Admitting: Women's Health

## 2013-03-25 DIAGNOSIS — F329 Major depressive disorder, single episode, unspecified: Secondary | ICD-10-CM

## 2013-03-25 DIAGNOSIS — F32A Depression, unspecified: Secondary | ICD-10-CM

## 2013-03-25 MED ORDER — ALPRAZOLAM 0.25 MG PO TABS: 0.2500 mg | ORAL_TABLET | Freq: Every evening | ORAL | Status: DC | PRN

## 2013-03-25 NOTE — Telephone Encounter (Signed)
Called into pharmacy

## 2013-04-29 ENCOUNTER — Ambulatory Visit (INDEPENDENT_AMBULATORY_CARE_PROVIDER_SITE_OTHER): Payer: BC Managed Care – PPO | Admitting: Women's Health

## 2013-04-29 ENCOUNTER — Encounter: Payer: Self-pay | Admitting: Women's Health

## 2013-04-29 DIAGNOSIS — N390 Urinary tract infection, site not specified: Secondary | ICD-10-CM

## 2013-04-29 DIAGNOSIS — R300 Dysuria: Secondary | ICD-10-CM

## 2013-04-29 LAB — URINALYSIS W MICROSCOPIC + REFLEX CULTURE
Bilirubin Urine: NEGATIVE
CRYSTALS: NONE SEEN
Casts: NONE SEEN
Glucose, UA: NEGATIVE mg/dL
KETONES UR: NEGATIVE mg/dL
NITRITE: POSITIVE — AB
Protein, ur: 30 mg/dL — AB
UROBILINOGEN UA: 0.2 mg/dL (ref 0.0–1.0)
pH: 6 (ref 5.0–8.0)

## 2013-04-29 MED ORDER — CIPROFLOXACIN HCL 250 MG PO TABS: 250.0000 mg | ORAL_TABLET | Freq: Two times a day (BID) | ORAL | Status: DC

## 2013-04-29 MED ORDER — SULFAMETHOXAZOLE-TRIMETHOPRIM 800-160 MG PO TABS: 1.0000 | ORAL_TABLET | Freq: Two times a day (BID) | ORAL | Status: DC

## 2013-04-29 NOTE — Progress Notes (Signed)
Patient ID: Jennifer [name], female   DOB: [birth year], 58 y.o.   MRN: [id number] And presents with complaint of increased urinary frequency, urgency, or pain at end of stream for 3 days. Denies any vaginal discharge or symptoms, abdominal pain or fever. TVH on no HRT.  Exam: Appears well.  no CVAT. UA: Positive blood, nitrites, large leukocytes, TNTC wbc's, many bacteria.  UTI  Plan: Cipro 250 twice daily for 5 days. Prescription, proper use given and reviewed. States usually has one UTI a year. Instructed to call if no relief of symptoms. UTI prevention discussed.

## 2013-04-29 NOTE — Patient Instructions (Signed)
Urinary Tract Infection  Urinary tract infections (UTIs) can develop anywhere along your urinary tract. Your urinary tract is your body's drainage system for removing wastes and extra water. Your urinary tract includes two kidneys, two ureters, a bladder, and a urethra. Your kidneys are a pair of bean-shaped organs. Each kidney is about the size of your fist. They are located below your ribs, one on each side of your spine.  CAUSES  Infections are caused by microbes, which are microscopic organisms, including fungi, viruses, and bacteria. These organisms are so small that they can only be seen through a microscope. Bacteria are the microbes that most commonly cause UTIs.  SYMPTOMS   Symptoms of UTIs may vary by age and gender of the patient and by the location of the infection. Symptoms in Jennifer [name] women typically include a frequent and intense urge to urinate and a painful, burning feeling in the bladder or urethra during urination. Older women and men are more likely to be tired, shaky, and weak and have muscle aches and abdominal pain. A fever may mean the infection is in your kidneys. Other symptoms of a kidney infection include pain in your back or sides below the ribs, nausea, and vomiting.  DIAGNOSIS  To diagnose a UTI, your caregiver will ask you about your symptoms. Your caregiver also will ask to provide a urine sample. The urine sample will be tested for bacteria and white blood cells. White blood cells are made by your body to help fight infection.  TREATMENT   Typically, UTIs can be treated with medication. Because most UTIs are caused by a bacterial infection, they usually can be treated with the use of antibiotics. The choice of antibiotic and length of treatment depend on your symptoms and the type of bacteria causing your infection.  HOME CARE INSTRUCTIONS   If you were prescribed antibiotics, take them exactly as your caregiver instructs you. Finish the medication even if you feel better after you  have only taken some of the medication.   Drink enough water and fluids to keep your urine clear or pale yellow.   Avoid caffeine, tea, and carbonated beverages. They tend to irritate your bladder.   Empty your bladder often. Avoid holding urine for long periods of time.   Empty your bladder before and after sexual intercourse.   After a bowel movement, women should cleanse from front to back. Use each tissue only once.  SEEK MEDICAL CARE IF:    You have back pain.   You develop a fever.   Your symptoms do not begin to resolve within 3 days.  SEEK IMMEDIATE MEDICAL CARE IF:    You have severe back pain or lower abdominal pain.   You develop chills.   You have nausea or vomiting.   You have continued burning or discomfort with urination.  MAKE SURE YOU:    Understand these instructions.   Will watch your condition.   Will get help right away if you are not doing well or get worse.  Document Released: 12/08/2004 Document Revised: 08/30/2011 Document Reviewed: 04/08/2011  ExitCare Patient Information 2014 [employer].

## 2013-04-29 NOTE — Addendum Note (Signed)
Addended by: Su Grand A on: 04/29/2013 10:10 AM   Modules accepted: Orders

## 2013-05-01 LAB — URINE CULTURE

## 2013-06-13 ENCOUNTER — Encounter: Payer: Self-pay | Admitting: Women's Health

## 2013-06-25 ENCOUNTER — Other Ambulatory Visit: Payer: Self-pay | Admitting: Women's Health

## 2013-06-25 DIAGNOSIS — F329 Major depressive disorder, single episode, unspecified: Secondary | ICD-10-CM

## 2013-06-25 DIAGNOSIS — F32A Depression, unspecified: Secondary | ICD-10-CM

## 2013-06-25 MED ORDER — ALPRAZOLAM 0.25 MG PO TABS: 0.2500 mg | ORAL_TABLET | Freq: Every evening | ORAL | Status: DC | PRN

## 2013-06-25 NOTE — Telephone Encounter (Signed)
Called into pharmacy

## 2013-08-29 ENCOUNTER — Telehealth: Payer: Self-pay | Admitting: *Deleted

## 2013-08-29 DIAGNOSIS — F32A Depression, unspecified: Secondary | ICD-10-CM

## 2013-08-29 DIAGNOSIS — F329 Major depressive disorder, single episode, unspecified: Secondary | ICD-10-CM

## 2013-08-29 MED ORDER — CITALOPRAM HYDROBROMIDE 10 MG PO TABS: 10.0000 mg | ORAL_TABLET | Freq: Every day | ORAL | Status: DC

## 2013-08-29 NOTE — Telephone Encounter (Signed)
Pt calling requesting refill on celexa 10 mg, pt overdue for annual. Pt said she will call back next week to schedule, having family issues with daughter. Pt said you can call her on cell # if needed. Okay to refill?

## 2013-08-29 NOTE — Telephone Encounter (Signed)
Ok to refill celexa, daughter had to have a termination - anecephalic fetus. Please extend our condolences.

## 2013-08-29 NOTE — Telephone Encounter (Signed)
rx sent

## 2014-01-13 ENCOUNTER — Encounter: Payer: Self-pay | Admitting: Women's Health

## 2014-08-07 ENCOUNTER — Encounter: Payer: Self-pay | Admitting: Women's Health

## 2014-08-08 ENCOUNTER — Encounter: Payer: Self-pay | Admitting: Women's Health

## 2014-09-23 ENCOUNTER — Encounter: Payer: Self-pay | Admitting: Internal Medicine

## 2015-10-28 ENCOUNTER — Encounter: Payer: Self-pay | Admitting: Women's Health

## 2017-03-03 ENCOUNTER — Encounter: Payer: Self-pay | Admitting: Women's Health

## 2018-03-21 ENCOUNTER — Encounter: Payer: Self-pay | Admitting: Women's Health

## 2018-03-21 DIAGNOSIS — Z1231 Encounter for screening mammogram for malignant neoplasm of breast: Secondary | ICD-10-CM | POA: Diagnosis not present

## 2018-03-21 DIAGNOSIS — Z803 Family history of malignant neoplasm of breast: Secondary | ICD-10-CM | POA: Diagnosis not present

## 2018-07-06 ENCOUNTER — Encounter: Payer: Self-pay | Admitting: Gastroenterology

## 2018-08-29 ENCOUNTER — Other Ambulatory Visit: Payer: Self-pay

## 2018-08-29 ENCOUNTER — Ambulatory Visit: Payer: BC Managed Care – PPO | Admitting: *Deleted

## 2018-08-29 VITALS — Ht 65.0 in | Wt 170.0 lb

## 2018-08-29 DIAGNOSIS — Z1211 Encounter for screening for malignant neoplasm of colon: Secondary | ICD-10-CM

## 2018-08-29 MED ORDER — NA SULFATE-K SULFATE-MG SULF 17.5-3.13-1.6 GM/177ML PO SOLN: 1.0000 | Freq: Once | ORAL | 0 refills | Status: AC

## 2018-08-29 NOTE — Progress Notes (Signed)

## 2018-09-12 ENCOUNTER — Encounter: Payer: Self-pay | Admitting: Gastroenterology

## 2018-10-10 ENCOUNTER — Telehealth: Payer: Self-pay | Admitting: Gastroenterology

## 2018-10-10 NOTE — Telephone Encounter (Signed)

## 2018-10-11 ENCOUNTER — Other Ambulatory Visit: Payer: Self-pay

## 2018-10-11 ENCOUNTER — Encounter: Payer: Self-pay | Admitting: Gastroenterology

## 2018-10-11 ENCOUNTER — Ambulatory Visit (AMBULATORY_SURGERY_CENTER): Payer: BC Managed Care – PPO | Admitting: Gastroenterology

## 2018-10-11 VITALS — BP 138/86 | HR 65 | Temp 98.4°F | Resp 16 | Ht 65.0 in | Wt 170.0 lb

## 2018-10-11 DIAGNOSIS — K6389 Other specified diseases of intestine: Secondary | ICD-10-CM | POA: Diagnosis not present

## 2018-10-11 DIAGNOSIS — D120 Benign neoplasm of cecum: Secondary | ICD-10-CM

## 2018-10-11 DIAGNOSIS — Z1211 Encounter for screening for malignant neoplasm of colon: Secondary | ICD-10-CM

## 2018-10-11 DIAGNOSIS — K635 Polyp of colon: Secondary | ICD-10-CM | POA: Diagnosis not present

## 2018-10-11 MED ORDER — SODIUM CHLORIDE 0.9 % IV SOLN: 500.0000 mL | Freq: Once | INTRAVENOUS | Status: DC

## 2018-10-11 NOTE — Op Note (Signed)
[facility] Patient Name: Jennifer [name] Procedure Date: 10/11/2018 3:12 PM MRN: [id number] Endoscopist: Thornton Park MD, MD Age: 63 Referring MD:  Date of Birth: 03/06/1956 Gender: Female Account #: [id number] Procedure:                Colonoscopy Indications:              Screening for colorectal malignant neoplasm                           Prior normal screening colonoscopy with Dr. Olevia Perches                            05/07/08                           No known family history of colon cancer or polyps Medicines:                See the Anesthesia note for documentation of the                            administered medications Procedure:                Pre-Anesthesia Assessment:                           - Prior to the procedure, a History and Physical                            was performed, and patient medications and                            allergies were reviewed. The patient's tolerance of                            previous anesthesia was also reviewed. The risks                            and benefits of the procedure and the sedation                            options and risks were discussed with the patient.                            All questions were answered, and informed consent                            was obtained. Prior Anticoagulants: The patient has                            taken no previous anticoagulant or antiplatelet                            agents. ASA Grade Assessment: II - A patient with  mild systemic disease. After reviewing the risks                            and benefits, the patient was deemed in                            satisfactory condition to undergo the procedure.                           After obtaining informed consent, the colonoscope                            was passed under direct vision. Throughout the                            procedure, the patient's blood pressure, pulse, and                    oxygen saturations were monitored continuously. The                            Colonoscope was introduced through the anus and                            advanced to the the terminal ileum, with                            identification of the appendiceal orifice and IC                            valve. A second forward view of the right colon was                            obtained. The colonoscopy was performed without                            difficulty. The patient tolerated the procedure                            well. The quality of the bowel preparation was                            good. The terminal ileum, ileocecal valve,                            appendiceal orifice, and rectum were photographed. Scope In: 3:18:42 PM Scope Out: 3:31:22 PM Scope Withdrawal Time: 0 hours 8 minutes 38 seconds  Total Procedure Duration: 0 hours 12 minutes 40 seconds  Findings:                 The perianal and digital rectal examinations were                            normal except for small internal hemorrhoids.  A diffuse area of moderate melanosis was found in                            the entire colon.                           A <1 mm polyp was found in the cecum. The polyp was                            sessile. The polyp was removed with a cold biopsy                            forceps. Resection and retrieval were complete.                            Estimated blood loss was minimal.                           The exam was otherwise without abnormality on                            direct and retroflexion views. Complications:            No immediate complications. Estimated blood loss:                            Minimal. Estimated Blood Loss:     Estimated blood loss was minimal. Impression:               - Melanosis in the colon.                           - One <1 mm polyp in the cecum, removed with a cold                            biopsy  forceps. Resected and retrieved.                           - The examination was otherwise normal on direct                            and retroflexion views. Recommendation:           - Patient has a contact number available for                            emergencies. The signs and symptoms of potential                            delayed complications were discussed with the                            patient. Return to normal activities tomorrow.  Written discharge instructions were provided to the                            patient.                           - Resume regular diet.                           - Continue present medications.                           - Await pathology results.                           - Repeat colonoscopy in 7 years for surveillance if                            the polyp is an adenoma, otherwise repeat                            colonoscopy in 10 years. Thornton Park MD, MD 10/11/2018 3:40:49 PM This report has been signed electronically.

## 2018-10-11 NOTE — Progress Notes (Signed)
PT taken to PACU. Monitors in place. VSS. Report given to RN. 

## 2018-10-11 NOTE — Progress Notes (Signed)
Called to room to assist during endoscopic procedure.  Patient ID and intended procedure confirmed with present staff. Received instructions for my participation in the procedure from the performing physician.  

## 2018-10-11 NOTE — Patient Instructions (Signed)
Please read handouts provided. Await pathology results. Continue present medications.      YOU HAD AN ENDOSCOPIC PROCEDURE TODAY AT THE [facility]:   Refer to the procedure report that was given to you for any specific questions about what was found during the examination.  If the procedure report does not answer your questions, please call your gastroenterologist to clarify.  If you requested that your care partner not be given the details of your procedure findings, then the procedure report has been included in a sealed envelope for you to review at your convenience later.  YOU SHOULD EXPECT: Some feelings of bloating in the abdomen. Passage of more gas than usual.  Walking can help get rid of the air that was put into your GI tract during the procedure and reduce the bloating. If you had a lower endoscopy (such as a colonoscopy or flexible sigmoidoscopy) you may notice spotting of blood in your stool or on the toilet paper. If you underwent a bowel prep for your procedure, you may not have a normal bowel movement for a few days.  Please Note:  You might notice some irritation and congestion in your nose or some drainage.  This is from the oxygen used during your procedure.  There is no need for concern and it should clear up in a day or so.  SYMPTOMS TO REPORT IMMEDIATELY:   Following lower endoscopy (colonoscopy or flexible sigmoidoscopy):  Excessive amounts of blood in the stool  Significant tenderness or worsening of abdominal pains  Swelling of the abdomen that is new, acute  Fever of 100F or higher    For urgent or emergent issues, a gastroenterologist can be reached at any hour by calling [phone].   DIET:  We do recommend a small meal at first, but then you may proceed to your regular diet.  Drink plenty of fluids but you should avoid alcoholic beverages for 24 hours.  ACTIVITY:  You should plan to take it easy for the rest of today and you should NOT  DRIVE or use heavy machinery until tomorrow (because of the sedation medicines used during the test).    FOLLOW UP: Our staff will call the number listed on your records 48-72 hours following your procedure to check on you and address any questions or concerns that you may have regarding the information given to you following your procedure. If we do not reach you, we will leave a message.  We will attempt to reach you two times.  During this call, we will ask if you have developed any symptoms of COVID 19. If you develop any symptoms (ie: fever, flu-like symptoms, shortness of breath, cough etc.) before then, please call [phone].  If you test positive for Covid 19 in the 2 weeks post procedure, please call and report this information to us.    If any biopsies were taken you will be contacted by phone or by letter within the next 1-3 weeks.  Please call us at [phone] if you have not heard about the biopsies in 3 weeks.    SIGNATURES/CONFIDENTIALITY: You and/or your care partner have signed paperwork which will be entered into your electronic medical record.  These signatures attest to the fact that that the information above on your After Visit Summary has been reviewed and is understood.  Full responsibility of the confidentiality of this discharge information lies with you and/or your care-partner. 

## 2018-10-15 ENCOUNTER — Telehealth: Payer: Self-pay

## 2018-10-15 NOTE — Telephone Encounter (Signed)
  Follow up Call-  Call back number 10/11/2018  Post procedure Call Back phone  # [phone]  Permission to leave phone message Yes  Some recent data might be hidden     Patient questions:  Do you have a fever, pain , or abdominal swelling? No. Pain Score  0 *  Have you tolerated food without any problems? Yes.    Have you been able to return to your normal activities? Yes.    Do you have any questions about your discharge instructions: Diet   No. Medications  No. Follow up visit  No.  Do you have questions or concerns about your Care? No.  Actions: * If pain score is 4 or above: No action needed, pain <4. 1. Have you developed a fever since your procedure? no  2.   Have you had an respiratory symptoms (SOB or cough) since your procedure? no  3.   Have you tested positive for COVID 19 since your procedure no  4.   Have you had any family members/close contacts diagnosed with the COVID 19 since your procedure?  no   If yes to any of these questions please route to Joylene John, RN and Alphonsa Gin, Therapist, sports.

## 2018-10-16 ENCOUNTER — Encounter: Payer: Self-pay | Admitting: Gastroenterology

## 2018-12-31 ENCOUNTER — Telehealth: Payer: Self-pay | Admitting: Gastroenterology

## 2018-12-31 NOTE — Telephone Encounter (Signed)
Spoke to the patient who reported her colonoscopy was coded as diagnostic because a polyp was found. Per the patient's letter that was sent out, the "polyp" that was found was actually just a normal fold of colon and not a polyp at all. Due to this discovery, the patient's colonoscopy should have been billed as screening (20210 colon was also free of polyps) and not diagnostic. The patient stated that [place] has to resend a corrected claim to her insurance coded as preventative since she in fact had no polyps. Irwin Brakeman, office manage notified.

## 2018-12-31 NOTE — Telephone Encounter (Signed)
Pt is very upset and wanted documented that she will not be paying the $1300 bill that she had received for her colonoscopy.  Pt stated that her understanding was that her colonoscopy on 10/11/18 would be preventative but was coded as diagnostic because a polyp was removed.

## 2018-12-31 NOTE — Telephone Encounter (Signed)
I understand that this is frustrating. A screening colonoscopy becomes a diagnostic colonoscopy when a polyp is removed. Please refer her to our billing specialists to help address her questions. Thank you.

## 2019-01-15 NOTE — Telephone Encounter (Signed)
Jennifer [name] with [employer] called to inform that pt's benefits cover colon with or without polyp 100% if it is coded as preventive. She stated that based on pt's history, procedure should have been coded as preventive.

## 2019-01-24 DIAGNOSIS — L820 Inflamed seborrheic keratosis: Secondary | ICD-10-CM | POA: Diagnosis not present

## 2019-01-24 DIAGNOSIS — Z1283 Encounter for screening for malignant neoplasm of skin: Secondary | ICD-10-CM | POA: Diagnosis not present

## 2019-01-24 DIAGNOSIS — D225 Melanocytic nevi of trunk: Secondary | ICD-10-CM | POA: Diagnosis not present

## 2019-01-24 DIAGNOSIS — L821 Other seborrheic keratosis: Secondary | ICD-10-CM | POA: Diagnosis not present

## 2019-03-27 ENCOUNTER — Encounter: Payer: Self-pay | Admitting: Obstetrics and Gynecology

## 2019-03-27 DIAGNOSIS — Z1231 Encounter for screening mammogram for malignant neoplasm of breast: Secondary | ICD-10-CM | POA: Diagnosis not present

## 2019-03-27 LAB — HM MAMMOGRAPHY

## 2019-04-04 DIAGNOSIS — M67372 Transient synovitis, left ankle and foot: Secondary | ICD-10-CM | POA: Diagnosis not present

## 2019-04-04 DIAGNOSIS — M67371 Transient synovitis, right ankle and foot: Secondary | ICD-10-CM | POA: Diagnosis not present

## 2019-04-04 DIAGNOSIS — M19072 Primary osteoarthritis, left ankle and foot: Secondary | ICD-10-CM | POA: Diagnosis not present

## 2019-04-04 DIAGNOSIS — M19071 Primary osteoarthritis, right ankle and foot: Secondary | ICD-10-CM | POA: Diagnosis not present

## 2019-04-05 DIAGNOSIS — B351 Tinea unguium: Secondary | ICD-10-CM | POA: Diagnosis not present

## 2019-05-09 DIAGNOSIS — B351 Tinea unguium: Secondary | ICD-10-CM | POA: Diagnosis not present

## 2019-06-06 DIAGNOSIS — B351 Tinea unguium: Secondary | ICD-10-CM | POA: Diagnosis not present

## 2019-06-06 DIAGNOSIS — O0000 Abdominal pregnancy without intrauterine pregnancy: Secondary | ICD-10-CM | POA: Diagnosis not present

## 2019-07-04 DIAGNOSIS — B351 Tinea unguium: Secondary | ICD-10-CM | POA: Diagnosis not present

## 2019-07-04 DIAGNOSIS — O0000 Abdominal pregnancy without intrauterine pregnancy: Secondary | ICD-10-CM | POA: Diagnosis not present

## 2019-10-23 ENCOUNTER — Encounter: Payer: Self-pay | Admitting: Nurse Practitioner

## 2019-10-23 ENCOUNTER — Other Ambulatory Visit: Payer: Self-pay

## 2019-10-23 ENCOUNTER — Ambulatory Visit (INDEPENDENT_AMBULATORY_CARE_PROVIDER_SITE_OTHER): Payer: BC Managed Care – PPO | Admitting: Nurse Practitioner

## 2019-10-23 VITALS — BP 122/80 | Ht 65.0 in | Wt 148.0 lb

## 2019-10-23 DIAGNOSIS — Z780 Asymptomatic menopausal state: Secondary | ICD-10-CM

## 2019-10-23 DIAGNOSIS — Z8639 Personal history of other endocrine, nutritional and metabolic disease: Secondary | ICD-10-CM

## 2019-10-23 DIAGNOSIS — Z13220 Encounter for screening for lipoid disorders: Secondary | ICD-10-CM

## 2019-10-23 DIAGNOSIS — Z01419 Encounter for gynecological examination (general) (routine) without abnormal findings: Secondary | ICD-10-CM | POA: Diagnosis not present

## 2019-10-23 DIAGNOSIS — K469 Unspecified abdominal hernia without obstruction or gangrene: Secondary | ICD-10-CM

## 2019-10-23 DIAGNOSIS — Z90710 Acquired absence of both cervix and uterus: Secondary | ICD-10-CM | POA: Diagnosis not present

## 2019-10-23 DIAGNOSIS — Z13820 Encounter for screening for osteoporosis: Secondary | ICD-10-CM

## 2019-10-23 DIAGNOSIS — N816 Rectocele: Secondary | ICD-10-CM

## 2019-10-23 DIAGNOSIS — E559 Vitamin D deficiency, unspecified: Secondary | ICD-10-CM | POA: Diagnosis not present

## 2019-10-23 NOTE — Progress Notes (Signed)
° °  Jennifer [name] [birth year] [id number]   History:  [name] presents for annual exam without GYN complaints. 2005 TVH with bladder repair. No HRT. Normal pap and mammogram history.   Gynecologic History No LMP recorded. Patient has had a hysterectomy.   Last Pap: 2012. Results were: normal Last mammogram: 03/27/2019. Results were: normal Last colonoscopy: 10/11/2018. Results were: small polyp Last Dexa: 07/08/2010. Results were: normal  Past medical history, past surgical history, family history and social history were all reviewed and documented in the EPIC chart.  ROS:  A ROS was performed and pertinent positives and negatives are included.  Exam:  Vitals:   10/23/19 1429  BP: 122/80  Weight: 148 lb (67.1 kg)  Height: 5\' 5"  (1.651 m)   Body mass index is 24.63 kg/m.  General appearance:  Normal Thyroid:  Symmetrical, normal in size, without palpable masses or nodularity. Respiratory  Auscultation:  Clear without wheezing or rhonchi Cardiovascular  Auscultation:  Regular rate, without rubs, murmurs or gallops  Edema/varicosities:  Not grossly evident Abdominal  Soft,nontender, without masses, guarding or rebound.  Liver/spleen:  No organomegaly noted  Hernia:  None appreciated  Skin  Inspection:  Grossly normal   Breasts: Examined lying and sitting.   Right: Without masses, retractions, discharge or axillary adenopathy.   Left: Without masses, retractions, discharge or axillary adenopathy. Gentitourinary   Inguinal/mons:  Normal without inguinal adenopathy  External genitalia:  Normal  BUS/Urethra/Skene's glands:  Normal  Vagina:  Atrophy, enterocele/rectocele  Cervix:  Absent  Uterus:  Absent  Adnexa/parametria:     Rt: Without masses or tenderness.   Lt: Without masses or tenderness.  Anus and perineum: Normal  Digital rectal exam: Normal sphincter tone without palpated masses or tenderness  Assessment/Plan:  64 y.o. [id number] for annual exam.   Well  female exam with routine gynecological exam - Plan: CBC with Differential/Platelet, Comprehensive metabolic panel. Education provided on SBEs, importance of preventative screenings, current guidelines, high calcium diet, regular exercise, and multivitamin daily.   Postmenopausal - Plan: DG Bone Density  Screening for osteoporosis - Plan: DG Bone Density, VITAMIN D 25 Hydroxy (Vit-D Deficiency, Fractures). Overdue. Last in 2012-normal.  History of total vaginal hysterectomy - with bladder repair. No HRT  Lipid screening - Plan: Lipid panel  History of vitamin D deficiency - Plan: VITAMIN D 25 Hydroxy (Vit-D Deficiency, Fractures)  Female rectocele with enterocele - stage 1. Recommend Kegel exercises to help prevent progression. She does have urine leakage at times.  Follow up in 1 year for annual       [place], 3:00 PM 10/23/2019

## 2019-10-23 NOTE — Patient Instructions (Addendum)
Start multivitamin  Health Maintenance for Postmenopausal Women Menopause is a normal process in which your ability to get pregnant comes to an end. This process happens slowly over many months or years, usually between the ages of 92 and 30. Menopause is complete when you have missed your menstrual periods for 12 months. It is important to talk with your health care provider about some of the most common conditions that affect women after menopause (postmenopausal women). These include heart disease, cancer, and bone loss (osteoporosis). Adopting a healthy lifestyle and getting preventive care can help to promote your health and wellness. The actions you take can also lower your chances of developing some of these common conditions. What should I know about menopause? During menopause, you may get a number of symptoms, such as:  Hot flashes. These can be moderate or severe.  Night sweats.  Decrease in sex drive.  Mood swings.  Headaches.  Tiredness.  Irritability.  Memory problems.  Insomnia. Choosing to treat or not to treat these symptoms is a decision that you make with your health care provider. Do I need hormone replacement therapy?  Hormone replacement therapy is effective in treating symptoms that are caused by menopause, such as hot flashes and night sweats.  Hormone replacement carries certain risks, especially as you become older. If you are thinking about using estrogen or estrogen with progestin, discuss the benefits and risks with your health care provider. What is my risk for heart disease and stroke? The risk of heart disease, heart attack, and stroke increases as you age. One of the causes may be a change in the body's hormones during menopause. This can affect how your body uses dietary fats, triglycerides, and cholesterol. Heart attack and stroke are medical emergencies. There are many things that you can do to help prevent heart disease and stroke. Watch your  blood pressure  High blood pressure causes heart disease and increases the risk of stroke. This is more likely to develop in people who have high blood pressure readings, are of African descent, or are overweight.  Have your blood pressure checked: ? Every 3-5 years if you are 58-79 years of age. ? Every year if you are 72 years old or older. Eat a healthy diet   Eat a diet that includes plenty of vegetables, fruits, low-fat dairy products, and lean protein.  Do not eat a lot of foods that are high in solid fats, added sugars, or sodium. Get regular exercise Get regular exercise. This is one of the most important things you can do for your health. Most adults should:  Try to exercise for at least 150 minutes each week. The exercise should increase your heart rate and make you sweat (moderate-intensity exercise).  Try to do strengthening exercises at least twice each week. Do these in addition to the moderate-intensity exercise.  Spend less time sitting. Even light physical activity can be beneficial. Other tips  Work with your health care provider to achieve or maintain a healthy weight.  Do not use any products that contain nicotine or tobacco, such as cigarettes, e-cigarettes, and chewing tobacco. If you need help quitting, ask your health care provider.  Know your numbers. Ask your health care provider to check your cholesterol and your blood sugar (glucose). Continue to have your blood tested as directed by your health care provider. Do I need screening for cancer? Depending on your health history and family history, you may need to have cancer screening at different stages of  your life. This may include screening for:  Breast cancer.  Cervical cancer.  Lung cancer.  Colorectal cancer. What is my risk for osteoporosis? After menopause, you may be at increased risk for osteoporosis. Osteoporosis is a condition in which bone destruction happens more quickly than new bone  creation. To help prevent osteoporosis or the bone fractures that can happen because of osteoporosis, you may take the following actions:  If you are 19-50 years old, get at least 1,000 mg of calcium and at least 600 mg of vitamin D per day.  If you are older than age 50 but younger than age 70, get at least 1,200 mg of calcium and at least 600 mg of vitamin D per day.  If you are older than age 70, get at least 1,200 mg of calcium and at least 800 mg of vitamin D per day. Smoking and drinking excessive alcohol increase the risk of osteoporosis. Eat foods that are rich in calcium and vitamin D, and do weight-bearing exercises several times each week as directed by your health care provider. How does menopause affect my mental health? Depression may occur at any age, but it is more common as you become older. Common symptoms of depression include:  Low or sad mood.  Changes in sleep patterns.  Changes in appetite or eating patterns.  Feeling an overall lack of motivation or enjoyment of activities that you previously enjoyed.  Frequent crying spells. Talk with your health care provider if you think that you are experiencing depression. General instructions See your health care provider for regular wellness exams and vaccines. This may include:  Scheduling regular health, dental, and eye exams.  Getting and maintaining your vaccines. These include: ? Influenza vaccine. Get this vaccine each year before the flu season begins. ? Pneumonia vaccine. ? Shingles vaccine. ? Tetanus, diphtheria, and pertussis (Tdap) booster vaccine. Your health care provider may also recommend other immunizations. Tell your health care provider if you have ever been abused or do not feel safe at home. Summary  Menopause is a normal process in which your ability to get pregnant comes to an end.  This condition causes hot flashes, night sweats, decreased interest in sex, mood swings, headaches, or lack of  sleep.  Treatment for this condition may include hormone replacement therapy.  Take actions to keep yourself healthy, including exercising regularly, eating a healthy diet, watching your weight, and checking your blood pressure and blood sugar levels.  Get screened for cancer and depression. Make sure that you are up to date with all your vaccines. This information is not intended to replace advice given to you by your health care provider. Make sure you discuss any questions you have with your health care provider. Document Revised: 02/21/2018 Document Reviewed: 02/21/2018 Elsevier Patient Education  2020 Elsevier Inc.  

## 2019-10-24 ENCOUNTER — Other Ambulatory Visit: Payer: Self-pay | Admitting: Nurse Practitioner

## 2019-10-24 ENCOUNTER — Ambulatory Visit: Payer: Self-pay | Admitting: Nurse Practitioner

## 2019-10-24 DIAGNOSIS — E559 Vitamin D deficiency, unspecified: Secondary | ICD-10-CM

## 2019-10-24 LAB — COMPREHENSIVE METABOLIC PANEL
AG Ratio: 2 (calc) (ref 1.0–2.5)
ALT: 15 U/L (ref 6–29)
AST: 14 U/L (ref 10–35)
Albumin: 4.4 g/dL (ref 3.6–5.1)
Alkaline phosphatase (APISO): 101 U/L (ref 37–153)
BUN: 11 mg/dL (ref 7–25)
CO2: 25 mmol/L (ref 20–32)
Calcium: 9.3 mg/dL (ref 8.6–10.4)
Chloride: 104 mmol/L (ref 98–110)
Creat: 0.76 mg/dL (ref 0.50–0.99)
Globulin: 2.2 g/dL (calc) (ref 1.9–3.7)
Glucose, Bld: 89 mg/dL (ref 65–99)
Potassium: 4.1 mmol/L (ref 3.5–5.3)
Sodium: 138 mmol/L (ref 135–146)
Total Bilirubin: 0.5 mg/dL (ref 0.2–1.2)
Total Protein: 6.6 g/dL (ref 6.1–8.1)

## 2019-10-24 LAB — CBC WITH DIFFERENTIAL/PLATELET
Absolute Monocytes: 429 cells/uL (ref 200–950)
Basophils Absolute: 58 cells/uL (ref 0–200)
Basophils Relative: 0.9 %
Eosinophils Absolute: 192 cells/uL (ref 15–500)
Eosinophils Relative: 3 %
HCT: 42.2 % (ref 35.0–45.0)
Hemoglobin: 14.2 g/dL (ref 11.7–15.5)
Lymphs Abs: 1747 cells/uL (ref 850–3900)
MCH: 31 pg (ref 27.0–33.0)
MCHC: 33.6 g/dL (ref 32.0–36.0)
MCV: 92.1 fL (ref 80.0–100.0)
MPV: 10.4 fL (ref 7.5–12.5)
Monocytes Relative: 6.7 %
Neutro Abs: 3974 cells/uL (ref 1500–7800)
Neutrophils Relative %: 62.1 %
Platelets: 318 10*3/uL (ref 140–400)
RBC: 4.58 10*6/uL (ref 3.80–5.10)
RDW: 12.5 % (ref 11.0–15.0)
Total Lymphocyte: 27.3 %
WBC: 6.4 10*3/uL (ref 3.8–10.8)

## 2019-10-24 LAB — LIPID PANEL
Cholesterol: 163 mg/dL (ref <200)
HDL: 58 mg/dL (ref >=50)
LDL Cholesterol (Calc): 88 mg/dL (calc)
Non-HDL Cholesterol (Calc): 105 mg/dL (calc) (ref <130)
Total CHOL/HDL Ratio: 2.8 (calc) (ref <5.0)
Triglycerides: 82 mg/dL (ref <150)

## 2019-10-24 LAB — VITAMIN D 25 HYDROXY (VIT D DEFICIENCY, FRACTURES): Vit D, 25-Hydroxy: 25 ng/mL — ABNORMAL LOW (ref 30–100)

## 2019-10-24 MED ORDER — VITAMIN D (ERGOCALCIFEROL) 1.25 MG (50000 UNIT) PO CAPS: 50000.0000 [IU] | ORAL_CAPSULE | ORAL | 0 refills | Status: AC

## 2019-11-03 ENCOUNTER — Ambulatory Visit
Admission: EM | Admit: 2019-11-03 | Discharge: 2019-11-03 | Disposition: A | Discharge status: Home / Self Care | Payer: BC Managed Care – PPO | Attending: Physician Assistant | Admitting: Physician Assistant

## 2019-11-03 ENCOUNTER — Other Ambulatory Visit: Payer: Self-pay

## 2019-11-03 DIAGNOSIS — Z20822 Contact with and (suspected) exposure to covid-19: Secondary | ICD-10-CM

## 2019-11-03 DIAGNOSIS — R059 Cough, unspecified: Secondary | ICD-10-CM

## 2019-11-03 DIAGNOSIS — R112 Nausea with vomiting, unspecified: Secondary | ICD-10-CM

## 2019-11-03 MED ORDER — ONDANSETRON 4 MG PO TBDP
4.0000 mg | ORAL_TABLET | Freq: Once | ORAL | Status: AC
  Administered 2019-11-03: 4 mg via ORAL

## 2019-11-03 MED ORDER — DICYCLOMINE HCL 20 MG PO TABS: 20.0000 mg | ORAL_TABLET | Freq: Two times a day (BID) | ORAL | 0 refills | Status: DC

## 2019-11-03 MED ORDER — ONDANSETRON 4 MG PO TBDP: 4.0000 mg | ORAL_TABLET | Freq: Three times a day (TID) | ORAL | 0 refills | Status: DC | PRN

## 2019-11-03 NOTE — Discharge Instructions (Signed)
COVID PCR testing ordered. I would like you to quarantine until testing results. Zofran for nausea and vomiting as needed. Bentyl for abdominal cramping. Keep hydrated, you urine should be clear to pale yellow in color. Bland diet, advance as tolerated. Monitor for any worsening of symptoms, nausea or vomiting not controlled by medication, worsening abdominal pain, fever, go to the emergency department for further evaluation needed. If having shortness of breath/trouble breathing, go to the emergency department for further evaluation.

## 2019-11-03 NOTE — ED Provider Notes (Signed)
[facility]    CSN: [id number] Arrival date & time: 11/03/19  0802      History   Chief Complaint Chief Complaint  Patient presents with   COVID Test    HPI Jennifer [name] is a 64 y.o. female.   64 year old female comes in for 3 day of URI symptoms. Nausea, body aches, chills, subjective fever. 5 NBNB vomiting, not tolerating oral intake. Intermittent LUQ pain. Diarrhea without melena, hematochezia. Cough without rhinorrhea, nasal congestion, sore throat. Denies shortness of breath, loss of taste/smell. Positive COVID exposure. No COVID vaccine.      Past Medical History:  Diagnosis Date   ANA positive    Anemia    Clot    1990   Thrombosis of retinal vein of left eye     Patient Active Problem List   Diagnosis Date Noted   UTI (urinary tract infection) 06/07/2012   ANA positive     Past Surgical History:  Procedure Laterality Date   BLADDER SUSPENSION  2005   CESAREAN SECTION  1993   COLONOSCOPY     TOTAL VAGINAL HYSTERECTOMY  2005   post repair/sling   TUBAL LIGATION  2004    OB History    Gravida  3   Para  2   Term      Preterm      AB  1   Living  2     SAB  1   TAB      Ectopic      Multiple      Live Births               Home Medications    Prior to Admission medications   Medication Sig Start Date End Date Taking? Authorizing Provider  APPLE CIDER VINEGAR PO Take 1 tablet by mouth daily.    [provider]  dicyclomine (BENTYL) 20 MG tablet Take 1 tablet (20 mg total) by mouth 2 (two) times daily. 11/03/19   Tasia Catchings, Donavan Kerlin V, PA-C  FIBER PO Take 1 tablet by mouth daily.    [provider]  ondansetron (ZOFRAN ODT) 4 MG disintegrating tablet Take 1 tablet (4 mg total) by mouth every 8 (eight) hours as needed for nausea or vomiting. 11/03/19   Tasia Catchings, Greenley Martone V, PA-C  Vitamin D, Ergocalciferol, (DRISDOL) 1.25 MG (50000 UNIT) CAPS capsule Take 1 capsule (50,000 Units total) by mouth every 7 (seven)  days for 8 doses. 10/24/19 12/13/19  Tamela Gammon, NP    Family History Family History  Problem Relation Age of Onset   Heart disease Father    Hypertension Father    Colon polyps Mother    Colon cancer Neg Hx    Esophageal cancer Neg Hx    Rectal cancer Neg Hx    Stomach cancer Neg Hx     Social History Social History   Tobacco Use   Smoking status: Never Smoker   Smokeless tobacco: Never Used  Vaping Use   Vaping Use: Never used  Substance Use Topics   Alcohol use: Yes    Comment: occassional   Drug use: No     Allergies   Codeine   Review of Systems Review of Systems  Reason unable to perform ROS: See HPI as above.     Physical Exam Triage Vital Signs ED Triage Vitals [11/03/19 0824]  Enc Vitals Group     BP 129/80     Pulse Rate 89  Resp 15     Temp 99.2 F (37.3 C)     Temp Source Oral     SpO2 96 %     Weight      Height      Head Circumference      Peak Flow      Pain Score 6     Pain Loc      Pain Edu?      Excl. in [place]?    No data found.  Updated Vital Signs BP 129/80 (BP Location: Left Arm)    Pulse 89    Temp 99.2 F (37.3 C) (Oral)    Resp 15    SpO2 96%   Physical Exam Constitutional:      General: She is not in acute distress.    Appearance: Normal appearance. She is not ill-appearing, toxic-appearing or diaphoretic.  HENT:     Head: Normocephalic and atraumatic.     Mouth/Throat:     Mouth: Mucous membranes are moist.     Pharynx: Oropharynx is clear. Uvula midline.  Cardiovascular:     Rate and Rhythm: Normal rate and regular rhythm.     Heart sounds: Normal heart sounds. No murmur heard.  No friction rub. No gallop.   Pulmonary:     Effort: Pulmonary effort is normal. No accessory muscle usage, prolonged expiration, respiratory distress or retractions.     Comments: Lungs clear to auscultation without adventitious lung sounds. Abdominal:     General: Bowel sounds are normal.     Palpations: Abdomen  is soft.     Tenderness: There is no abdominal tenderness. There is no guarding or rebound.  Musculoskeletal:     Cervical back: Normal range of motion and neck supple.  Neurological:     General: No focal deficit present.     Mental Status: She is alert and oriented to person, place, and time.      UC Treatments / Results  Labs (all labs ordered are listed, but only abnormal results are displayed) Labs Reviewed  NOVEL CORONAVIRUS, NAA    EKG   Radiology No results found.  Procedures Procedures (including critical care time)  Medications Ordered in UC Medications  ondansetron (ZOFRAN-ODT) disintegrating tablet 4 mg (4 mg Oral Given 11/03/19 0835)    Initial Impression / Assessment and Plan / UC Course  I have reviewed the triage vital signs and the nursing notes.  Pertinent labs & imaging results that were available during my care of the patient were reviewed by me and considered in my medical decision making (see chart for details).    Patient afebrile without antipyretics.  Nontoxic in appearance.  Lungs clear to auscultation bilaterally without adventitious lung sounds.  Has not been able to tolerate oral intake due to nausea/vomiting.  Will provide Zofran in office and fluid challenge.  Able to tolerate fluid intake with good relief of nausea after zofran. COVID testing ordered. To quarantine until testing results return. Will continue symptomatic management. Push fluids. Bland diet, advance as tolerated. Return precautions given.  Final Clinical Impressions(s) / UC Diagnoses   Final diagnoses:  Exposure to COVID-19 virus  Nausea vomiting and diarrhea  Cough    ED Prescriptions    Medication Sig Dispense Auth. Provider   ondansetron (ZOFRAN ODT) 4 MG disintegrating tablet Take 1 tablet (4 mg total) by mouth every 8 (eight) hours as needed for nausea or vomiting. 20 tablet Zachery Niswander V, PA-C   dicyclomine (BENTYL) 20 MG tablet Take  1 tablet (20 mg total) by mouth 2  (two) times daily. 20 tablet Ok Edwards, PA-C     PDMP not reviewed this encounter.   Ok Edwards, PA-C 11/03/19 [phone]

## 2019-11-03 NOTE — ED Triage Notes (Signed)
Patient presents to the UC for COVID testing after her parents tested positive yesterday.  She reports symptoms of fever, nausea and body aches that started 3 days ago.

## 2019-11-04 LAB — SARS-COV-2, NAA 2 DAY TAT

## 2019-11-04 LAB — NOVEL CORONAVIRUS, NAA: SARS-CoV-2, NAA: DETECTED — AB

## 2019-11-06 ENCOUNTER — Other Ambulatory Visit: Payer: Self-pay | Admitting: Physician Assistant

## 2019-11-06 ENCOUNTER — Encounter: Payer: Self-pay | Admitting: Physician Assistant

## 2019-11-06 ENCOUNTER — Ambulatory Visit (HOSPITAL_COMMUNITY)
Admission: RE | Admit: 2019-11-06 | Discharge: 2019-11-06 | Disposition: A | Discharge status: Home / Self Care | Payer: BC Managed Care – PPO | Source: Ambulatory Visit | Attending: Pulmonary Disease | Admitting: Pulmonary Disease

## 2019-11-06 DIAGNOSIS — U071 COVID-19: Secondary | ICD-10-CM | POA: Insufficient documentation

## 2019-11-06 DIAGNOSIS — R768 Other specified abnormal immunological findings in serum: Secondary | ICD-10-CM

## 2019-11-06 MED ORDER — ALBUTEROL SULFATE HFA 108 (90 BASE) MCG/ACT IN AERS: 2.0000 | INHALATION_SPRAY | Freq: Once | RESPIRATORY_TRACT | Status: DC | PRN

## 2019-11-06 MED ORDER — SODIUM CHLORIDE 0.9 % IV SOLN: INTRAVENOUS | Status: DC | PRN

## 2019-11-06 MED ORDER — EPINEPHRINE 0.3 MG/0.3ML IJ SOAJ: 0.3000 mg | Freq: Once | INTRAMUSCULAR | Status: DC | PRN

## 2019-11-06 MED ORDER — FAMOTIDINE IN NACL 20-0.9 MG/50ML-% IV SOLN: 20.0000 mg | Freq: Once | INTRAVENOUS | Status: DC | PRN

## 2019-11-06 MED ORDER — DIPHENHYDRAMINE HCL 50 MG/ML IJ SOLN: 50.0000 mg | Freq: Once | INTRAMUSCULAR | Status: DC | PRN

## 2019-11-06 MED ORDER — METHYLPREDNISOLONE SODIUM SUCC 125 MG IJ SOLR: 125.0000 mg | Freq: Once | INTRAMUSCULAR | Status: DC | PRN

## 2019-11-06 MED ORDER — SODIUM CHLORIDE 0.9 % IV SOLN
1200.0000 mg | Freq: Once | INTRAVENOUS | Status: AC
  Administered 2019-11-06: 1200 mg via INTRAVENOUS
  Filled 2019-11-06: qty 10

## 2019-11-06 NOTE — Progress Notes (Signed)
I connected by phone with Jennifer [name] on 11/06/2019 at 12:55 PM to discuss the potential use of a new treatment for mild to moderate COVID-19 viral infection in non-hospitalized patients.  This patient is a 64 y.o. female that meets the FDA criteria for Emergency Use Authorization of COVID monoclonal antibody casirivimab/imdevimab.  Has a (+) direct SARS-CoV-2 viral test result  Has mild or moderate COVID-19   Is NOT hospitalized due to COVID-19  Is within 10 days of symptom onset  Has at least one of the high risk factor(s) for progression to severe COVID-19 and/or hospitalization as defined in EUA.  Specific high risk criteria : BMI > 25 and Other high risk medical condition per CDC:  clotting disorder   I have spoken and communicated the following to the patient or parent/caregiver regarding COVID monoclonal antibody treatment:  1. FDA has authorized the emergency use for the treatment of mild to moderate COVID-19 in adults and pediatric patients with positive results of direct SARS-CoV-2 viral testing who are 42 years of age and older weighing at least 40 kg, and who are at high risk for progressing to severe COVID-19 and/or hospitalization.  2. The significant known and potential risks and benefits of COVID monoclonal antibody, and the extent to which such potential risks and benefits are unknown.  3. Information on available alternative treatments and the risks and benefits of those alternatives, including clinical trials.  4. Patients treated with COVID monoclonal antibody should continue to self-isolate and use infection control measures (e.g., wear mask, isolate, social distance, avoid sharing personal items, clean and disinfect "high touch" surfaces, and frequent handwashing) according to CDC guidelines.   5. The patient or parent/caregiver has the option to accept or refuse COVID monoclonal antibody treatment.  After reviewing this information with the patient, The patient  agreed to proceed with receiving casirivimab\imdevimab infusion and will be provided a copy of the Fact sheet prior to receiving the infusion.  Sx onset 8/19. Set up for infusion on 8/25 @ 5pm.. Directions given to [facility]. Pt is aware that insurance will be charged an infusion fee. Pt is unvaccinated.  Angelena Form 11/06/2019 12:55 PM

## 2019-11-06 NOTE — Progress Notes (Signed)
  Diagnosis: COVID-19  Physician: Dr. Patrick Wright  Procedure: [facility] Infusion [facility] Med: casirivimab\imdevimab infusion - Provided patient with casirivimab\imdevimab fact sheet for patients, parents and caregivers prior to infusion.  Complications: No immediate complications noted.  Discharge: Discharged home   Dash Cardarelli 11/06/2019   

## 2019-11-06 NOTE — Discharge Instructions (Signed)

## 2019-11-27 ENCOUNTER — Telehealth: Payer: Self-pay | Admitting: Internal Medicine

## 2019-11-27 NOTE — Telephone Encounter (Signed)
Ok. Thanks!

## 2019-11-27 NOTE — Telephone Encounter (Signed)
Caller: Lanelle Call back # [phone]  Patient sisters Noele Icenhour) states she spokes to you in regards to her sister becoming your patient. Please advise

## 2019-11-28 NOTE — Telephone Encounter (Signed)
Patient was made aware of the decision she is going to call us back to schedule appointment

## 2019-12-03 ENCOUNTER — Other Ambulatory Visit: Payer: Self-pay

## 2019-12-03 ENCOUNTER — Other Ambulatory Visit: Payer: Self-pay | Admitting: Nurse Practitioner

## 2019-12-03 ENCOUNTER — Ambulatory Visit (INDEPENDENT_AMBULATORY_CARE_PROVIDER_SITE_OTHER): Payer: BC Managed Care – PPO

## 2019-12-03 DIAGNOSIS — Z780 Asymptomatic menopausal state: Secondary | ICD-10-CM

## 2019-12-03 DIAGNOSIS — Z13820 Encounter for screening for osteoporosis: Secondary | ICD-10-CM

## 2019-12-24 ENCOUNTER — Ambulatory Visit: Payer: BC Managed Care – PPO | Admitting: Family

## 2019-12-24 ENCOUNTER — Encounter: Payer: Self-pay | Admitting: Family

## 2019-12-24 ENCOUNTER — Other Ambulatory Visit: Payer: Self-pay

## 2019-12-24 VITALS — BP 119/77 | HR 81 | Temp 98.6°F | Resp 16 | Ht 65.0 in | Wt 146.0 lb

## 2019-12-24 DIAGNOSIS — Z23 Encounter for immunization: Secondary | ICD-10-CM | POA: Diagnosis not present

## 2019-12-24 DIAGNOSIS — H9192 Unspecified hearing loss, left ear: Secondary | ICD-10-CM | POA: Diagnosis not present

## 2019-12-24 DIAGNOSIS — Z0000 Encounter for general adult medical examination without abnormal findings: Secondary | ICD-10-CM | POA: Diagnosis not present

## 2019-12-24 DIAGNOSIS — E559 Vitamin D deficiency, unspecified: Secondary | ICD-10-CM

## 2019-12-24 DIAGNOSIS — U071 COVID-19: Secondary | ICD-10-CM | POA: Diagnosis not present

## 2019-12-24 MED ORDER — ASPIRIN 81 MG PO TBEC: 81.0000 mg | DELAYED_RELEASE_TABLET | Freq: Every day | ORAL | 12 refills | Status: DC

## 2019-12-24 MED ORDER — VITAMIN D3 75 MCG (3000 UT) PO TABS: 1.0000 | ORAL_TABLET | Freq: Every day | ORAL | Status: DC

## 2019-12-24 MED ORDER — MULTI-VITAMIN/MINERALS PO TABS: 1.0000 | ORAL_TABLET | Freq: Every day | ORAL | Status: DC

## 2019-12-24 NOTE — Progress Notes (Signed)
Subjective:    Patient ID: Jennifer [name], female    DOB: 1955/09/28, 64 y.o.   MRN: [id number]  HPI  Patient is a 64 yr old female who presents today to establish care.  Pmhx is significant for the following:  Covid-19-  Had monoclonal antibody treatment on 11/06/19. She will be eligible for covid-19 vaccination after 02/06/20.  Hx of thrombosis of retinal vein of the left eye- she had brief vision loss but reports no residual visual deficits. She was on an OCP at that time.  Used to take an aspirin 81mg  once daily but has not taken in some time.   Vit D deficiency- picked up by GYN.    Immunizations: shingrix today,  Flu shot today, unsure of last tetanus Diet: healthy Exercise: walks 5 times a week 5 miles Colonoscopy: 10/11/2018 Dexa: 2021 Pap Smear: N/A Mammogram: 1/21 Vision/Dental- up to date     Review of Systems  Constitutional: Negative for unexpected weight change.  HENT: Positive for hearing loss (deaf in left ear due to measles). Negative for rhinorrhea.   Eyes: Negative for visual disturbance.  Respiratory: Negative for cough and shortness of breath.   Cardiovascular: Negative for chest pain.  Gastrointestinal: Negative for constipation (uses a fiber supplement which is helpful) and diarrhea.  Genitourinary: Negative for dysuria and frequency.  Musculoskeletal: Positive for back pain (occasional sciatic nerve pain). Negative for arthralgias and myalgias.  Skin: Negative for rash.  Neurological: Negative for headaches.  Hematological: Negative for adenopathy.  Psychiatric/Behavioral:       Some anxious due to covid    Past Medical History:  Diagnosis Date   ANA positive    Anemia    Hearing loss 1962   left ear- due to measles   Thrombosis of retinal vein of left eye 1990   was on OCP at the time     Social History   Socioeconomic History   Marital status: Divorced    Spouse name: Not on file   Number of children: 2   Years of  education: Not on file   Highest education level: Not on file  Occupational History   Occupation: Research scientist (life sciences)  Tobacco Use   Smoking status: Never Smoker   Smokeless tobacco: Never Used  Scientific laboratory technician Use: Never used  Substance and Sexual Activity   Alcohol use: Yes    Comment: ocassional- every few months   Drug use: No   Sexual activity: Yes    Partners: Male    Birth control/protection: Surgical  Other Topics Concern   Not on file  Social History Narrative   Divorced in 2000   2 grown children- son and daughter- both local   Has a boyfriend of 10 years    Works as an Research scientist (life sciences)   Enjoys spending time at her [place].   Kayaking, walking, restaurants   No pets   Social Determinants of Health   Financial Resource Strain:    Difficulty of Paying Living Expenses: Not on file  Food Insecurity:    Worried About [place] in the Last Year: Not on file   Ran Out of Food in the Last Year: Not on file  Transportation Needs:    Lack of Transportation (Medical): Not on file   Lack of Transportation (Non-Medical): Not on file  Physical Activity: Sufficiently Active   Days of Exercise per Week: 5 days   Minutes of Exercise per Session: 70 min  Stress:  Feeling of Stress : Not on file  Social Connections:    Frequency of Communication with Friends and Family: Not on file   Frequency of Social Gatherings with Friends and Family: Not on file   Attends Religious Services: Not on file   Active Member of Clubs or Organizations: Not on file   Attends Archivist Meetings: Not on file   Marital Status: Not on file  Intimate Partner Violence:    Fear of Current or Ex-Partner: Not on file   Emotionally Abused: Not on file   Physically Abused: Not on file   Sexually Abused: Not on file    Past Surgical History:  Procedure Laterality Date   BLADDER SUSPENSION  2005   [place]  2005   post repair/sling   TUBAL LIGATION  2004    Family History  Problem Relation Age of Onset   Heart disease Father        AFib, MVP, borderline diabetes   Hypertension Father    CVA Father    Colon polyps Mother    Colon cancer Neg Hx    Esophageal cancer Neg Hx    Rectal cancer Neg Hx    Stomach cancer Neg Hx     Allergies  Allergen Reactions   Codeine     REACTION: Nausea and vomiting    No current outpatient medications on file prior to visit.   No current facility-administered medications on file prior to visit.    BP 119/77 (BP Location: Right Arm, Patient Position: Sitting, Cuff Size: Small)    Pulse 81    Temp 98.6 F (37 C) (Oral)    Resp 16    Ht 5\' 5"  (1.651 m)    Wt 146 lb (66.2 kg)    SpO2 97%    BMI 24.30 kg/m       Objective:   Physical Exam Constitutional:      Appearance: Normal appearance. She is well-developed.  HENT:     Head: Normocephalic and atraumatic.     Right Ear: Tympanic membrane and ear canal normal.     Left Ear: Tympanic membrane and ear canal normal.  Eyes:     General: No scleral icterus.    Extraocular Movements: Extraocular movements intact.     Pupils: Pupils are equal, round, and reactive to light.  Neck:     Thyroid: No thyroid mass.  Cardiovascular:     Rate and Rhythm: Normal rate and regular rhythm.     Heart sounds: Normal heart sounds. No murmur heard.   Pulmonary:     Effort: Pulmonary effort is normal. No respiratory distress.     Breath sounds: Normal breath sounds. No wheezing.  Musculoskeletal:     Cervical back: Neck supple.  Lymphadenopathy:     Cervical: No cervical adenopathy.  Neurological:     General: No focal deficit present.     Mental Status: She is alert and oriented to person, place, and time.     Comments: Fine head tremor noted  Psychiatric:        Behavior: Behavior normal.        Thought Content: Thought content normal.        Judgment: Judgment  normal.           Assessment & Plan:  Preventative care- flu shot and shingrix #1 today.  She will return in 2 months for Shingrix #2 and  Tdap. Mammo, colo and dexa are up to date.  Reviewed labs from her GYN performed in august.    Vit D deficiency- completed 8 week course of 68864 iu vit d weekly.  Will obtain follow up vit D level and initiate otc vit D 3000 iu once weekly.  Hx of retinal artery thrombosis- advised pt to resume aspirin 81mg  once daily for secondary prevention.  Her LDL was 88 back in August. She is not on a statin medication.    Hx of COVID-19 infection- received monoclonal antibody infusion. She plans to receive covid 19 vaccine after 02/06/20.  She reports that she has had some mild residual fatigue but that this is improving.  Hearing loss (left ear)- unchanged.  Has been present since age 32.   This visit occurred during the SARS-CoV-2 public health emergency.  Safety protocols were in place, including screening questions prior to the visit, additional usage of staff PPE, and extensive cleaning of exam room while observing appropriate contact time as indicated for disinfecting solutions.

## 2019-12-24 NOTE — Patient Instructions (Signed)
Please complete lab work prior to leaving. Add apirin 81mg , multivitamin once daily and vit D 3000 units daily. Continue healthy diet and regular exercise.  Welcome to [facility]!

## 2019-12-25 ENCOUNTER — Other Ambulatory Visit: Payer: BC Managed Care – PPO

## 2019-12-27 LAB — VITAMIN D 1,25 DIHYDROXY
Vitamin D 1, 25 (OH)2 Total: 63 pg/mL (ref 18–72)
Vitamin D2 1, 25 (OH)2: 29 pg/mL
Vitamin D3 1, 25 (OH)2: 34 pg/mL

## 2020-02-26 ENCOUNTER — Ambulatory Visit (INDEPENDENT_AMBULATORY_CARE_PROVIDER_SITE_OTHER): Payer: BC Managed Care – PPO

## 2020-02-26 ENCOUNTER — Other Ambulatory Visit: Payer: Self-pay

## 2020-02-26 DIAGNOSIS — Z23 Encounter for immunization: Secondary | ICD-10-CM

## 2020-02-26 NOTE — Progress Notes (Signed)
Patient came in today for a Tdap & Shingix shot per Melissa O'sullivan,NP.  Patient tolerated injection well.

## 2020-04-01 DIAGNOSIS — Z1231 Encounter for screening mammogram for malignant neoplasm of breast: Secondary | ICD-10-CM | POA: Diagnosis not present

## 2020-10-12 DIAGNOSIS — M5416 Radiculopathy, lumbar region: Secondary | ICD-10-CM | POA: Diagnosis not present

## 2020-10-15 DIAGNOSIS — M5416 Radiculopathy, lumbar region: Secondary | ICD-10-CM | POA: Diagnosis not present

## 2020-10-15 DIAGNOSIS — M545 Low back pain, unspecified: Secondary | ICD-10-CM | POA: Diagnosis not present

## 2020-10-19 DIAGNOSIS — M5416 Radiculopathy, lumbar region: Secondary | ICD-10-CM | POA: Diagnosis not present

## 2020-11-04 DIAGNOSIS — M5416 Radiculopathy, lumbar region: Secondary | ICD-10-CM | POA: Diagnosis not present

## 2020-12-30 ENCOUNTER — Encounter: Payer: BC Managed Care – PPO | Admitting: Family

## 2021-01-04 ENCOUNTER — Telehealth: Payer: Self-pay | Admitting: Family

## 2021-01-04 NOTE — Telephone Encounter (Signed)
Pt called regarding message in MyChart regarding no show on 10.19.22. Pt stated she received a phone call regarding appointment, she initially rescheduled appointment though automated system and does not want to be billed. Please advise.

## 2021-01-05 NOTE — Telephone Encounter (Signed)
I called and spoke with patient and advised I will change status to canceled appointment since appointment was not successfully canceled via automated system.  I asked if patient would like to reschedule her CPE at this time and she stated she has a new grand baby and is helping her daughter out with the baby right now, but will call office back later one to reschedule that appointment.

## 2021-02-17 ENCOUNTER — Ambulatory Visit
Admission: EM | Admit: 2021-02-17 | Discharge: 2021-02-17 | Disposition: A | Discharge status: Home / Self Care | Payer: BC Managed Care – PPO | Attending: Internal Medicine | Admitting: Internal Medicine

## 2021-02-17 ENCOUNTER — Encounter: Payer: Self-pay | Admitting: Emergency Medicine

## 2021-02-17 DIAGNOSIS — R110 Nausea: Secondary | ICD-10-CM

## 2021-02-17 DIAGNOSIS — R6889 Other general symptoms and signs: Secondary | ICD-10-CM

## 2021-02-17 DIAGNOSIS — Z20822 Contact with and (suspected) exposure to covid-19: Secondary | ICD-10-CM | POA: Diagnosis not present

## 2021-02-17 DIAGNOSIS — J069 Acute upper respiratory infection, unspecified: Secondary | ICD-10-CM

## 2021-02-17 MED ORDER — ONDANSETRON 4 MG PO TBDP: 4.0000 mg | ORAL_TABLET | Freq: Three times a day (TID) | ORAL | 0 refills | Status: DC | PRN

## 2021-02-17 MED ORDER — OSELTAMIVIR PHOSPHATE 75 MG PO CAPS: 75.0000 mg | ORAL_CAPSULE | Freq: Two times a day (BID) | ORAL | 0 refills | Status: DC

## 2021-02-17 MED ORDER — BENZONATATE 100 MG PO CAPS: 100.0000 mg | ORAL_CAPSULE | Freq: Three times a day (TID) | ORAL | 0 refills | Status: DC | PRN

## 2021-02-17 NOTE — Discharge Instructions (Signed)
It appears that you have a viral upper respiratory infection.  Highly suspicious for the flu.  You have been prescribed Tamiflu to treat this.  You have also been prescribed a medication to take as needed for cough and nausea.  Please increase water intake and monitor fevers as well.

## 2021-02-17 NOTE — ED Provider Notes (Signed)
[facility]    CSN: [id number] Arrival date & time: 02/17/21  1706      History   Chief Complaint Chief Complaint  Patient presents with   Influenza    HPI Jennifer [name] is a 65 y.o. female.   Patient presents with nonproductive cough, headache, nausea without vomiting, body aches, nasal congestion, sore throat that has been present for 2 days.  Daughter has had similar symptoms recently.  Denies chest pain, shortness of breath, ear pain, diarrhea, abdominal pain.  Patient has taken over-the-counter cough and cold medications with no improvement in symptoms.   Influenza  Past Medical History:  Diagnosis Date   ANA positive    Anemia    Hearing loss 1962   left ear- due to measles   Thrombosis of retinal vein of left eye 1990   was on OCP at the time    Patient Active Problem List   Diagnosis Date Noted   UTI (urinary tract infection) 06/07/2012   ANA positive     Past Surgical History:  Procedure Laterality Date   BLADDER SUSPENSION  2005   [place]   COLONOSCOPY     TOTAL VAGINAL HYSTERECTOMY  2005   post repair/sling   TUBAL LIGATION  2004    OB History     Gravida  3   Para  2   Term      Preterm      AB  1   Living  2      SAB  1   IAB      Ectopic      Multiple      Live Births               Home Medications    Prior to Admission medications   Medication Sig Start Date End Date Taking? Authorizing Provider  benzonatate (TESSALON) 100 MG capsule Take 1 capsule (100 mg total) by mouth every 8 (eight) hours as needed for cough. 02/17/21  Yes Laryn Venning, Hildred Alamin E, FNP  ondansetron (ZOFRAN-ODT) 4 MG disintegrating tablet Take 1 tablet (4 mg total) by mouth every 8 (eight) hours as needed for nausea or vomiting. 02/17/21  Yes Lucan Riner, Michele Rockers, FNP  oseltamivir (TAMIFLU) 75 MG capsule Take 1 capsule (75 mg total) by mouth every 12 (twelve) hours. 02/17/21  Yes Oswaldo Conroy E, FNP  aspirin (ASPIRIN 81) 81 MG EC  tablet Take 1 tablet (81 mg total) by mouth daily. Swallow whole. 12/24/19   Debbrah Alar, NP  Cholecalciferol (VITAMIN D3) 75 MCG (3000 UT) TABS Take 1 tablet by mouth daily. 12/24/19   Debbrah Alar, NP  Multiple Vitamins-Minerals (MULTIVITAMIN WITH MINERALS) tablet Take 1 tablet by mouth daily. 12/24/19   Debbrah Alar, NP    Family History Family History  Problem Relation Age of Onset   Heart disease Father        AFib, MVP, borderline diabetes   Hypertension Father    CVA Father    Colon polyps Mother    Colon cancer Neg Hx    Esophageal cancer Neg Hx    Rectal cancer Neg Hx    Stomach cancer Neg Hx     Social History Social History   Tobacco Use   Smoking status: Never   Smokeless tobacco: Never  Vaping Use   Vaping Use: Never used  Substance Use Topics   Alcohol use: Yes    Comment: ocassional- every few months   Drug use: No  Allergies   Codeine   Review of Systems Review of Systems Per HPI  Physical Exam Triage Vital Signs ED Triage Vitals  Enc Vitals Group     BP 02/17/21 1736 131/87     Pulse Rate 02/17/21 1736 91     Resp 02/17/21 1736 16     Temp 02/17/21 1736 98 F (36.7 C)     Temp Source 02/17/21 1736 Oral     SpO2 02/17/21 1736 94 %     Weight --      Height --      Head Circumference --      Peak Flow --      Pain Score 02/17/21 1738 5     Pain Loc --      Pain Edu? --      Excl. in [place]? --    No data found.  Updated Vital Signs BP 131/87 (BP Location: Left Arm)   Pulse 91   Temp 98 F (36.7 C) (Oral)   Resp 16   SpO2 94%   Visual Acuity Right Eye Distance:   Left Eye Distance:   Bilateral Distance:    Right Eye Near:   Left Eye Near:    Bilateral Near:     Physical Exam Constitutional:      General: She is not in acute distress.    Appearance: Normal appearance. She is not toxic-appearing or diaphoretic.  HENT:     Head: Normocephalic and atraumatic.     Right Ear: Tympanic membrane and ear  canal normal.     Left Ear: Tympanic membrane and ear canal normal.     Nose: Congestion present.     Mouth/Throat:     Mouth: Mucous membranes are moist.     Pharynx: No posterior oropharyngeal erythema.  Eyes:     Extraocular Movements: Extraocular movements intact.     Conjunctiva/sclera: Conjunctivae normal.     Pupils: Pupils are equal, round, and reactive to light.  Cardiovascular:     Rate and Rhythm: Normal rate and regular rhythm.     Pulses: Normal pulses.     Heart sounds: Normal heart sounds.  Pulmonary:     Effort: Pulmonary effort is normal. No respiratory distress.     Breath sounds: Normal breath sounds. No stridor. No wheezing, rhonchi or rales.  Abdominal:     General: Abdomen is flat. Bowel sounds are normal.     Palpations: Abdomen is soft.  Musculoskeletal:        General: Normal range of motion.     Cervical back: Normal range of motion.  Skin:    General: Skin is warm and dry.  Neurological:     General: No focal deficit present.     Mental Status: She is alert and oriented to person, place, and time. Mental status is at baseline.  Psychiatric:        Mood and Affect: Mood normal.        Behavior: Behavior normal.     UC Treatments / Results  Labs (all labs ordered are listed, but only abnormal results are displayed) Labs Reviewed  COVID-19, FLU A+B NAA    EKG   Radiology No results found.  Procedures Procedures (including critical care time)  Medications Ordered in UC Medications - No data to display  Initial Impression / Assessment and Plan / UC Course  I have reviewed the triage vital signs and the nursing notes.  Pertinent labs & imaging results that were available during  my care of the patient were reviewed by me and considered in my medical decision making (see chart for details).     Patient presents with symptoms likely from a viral upper respiratory infection. Differential includes bacterial pneumonia, sinusitis, allergic  rhinitis, Covid 19, flu. Do not suspect underlying cardiopulmonary process. Symptoms seem unlikely related to ACS, CHF or COPD exacerbations, pneumonia, pneumothorax. Patient is nontoxic appearing and not in need of emergent medical intervention.  Covid 19 and flu test pending.  Do not think that strep test is necessary given appearance of posterior pharynx on exam.  Highly suspicious for the flu given patient's current symptoms.  No rapid flu test available.  Will opt to treat for the flu given patient's duration of symptoms.  Recommended symptom control with over the counter medications: Daily oral anti-histamine, Oral decongestant or IN corticosteroid, saline irrigations, cepacol lozenges, Robitussin, Delsym, honey tea.  Ondansetron to take as needed for nausea and  Benzonatate to take as needed for cough.  Return if symptoms fail to improve in 1-2 weeks or you develop shortness of breath, chest pain, severe headache. Patient states understanding and is agreeable.  Discharged with PCP followup.  Final Clinical Impressions(s) / UC Diagnoses   Final diagnoses:  Flu-like symptoms  Viral upper respiratory tract infection with cough  Encounter for laboratory testing for COVID-19 virus  Nausea without vomiting     Discharge Instructions      It appears that you have a viral upper respiratory infection.  Highly suspicious for the flu.  You have been prescribed Tamiflu to treat this.  You have also been prescribed a medication to take as needed for cough and nausea.  Please increase water intake and monitor fevers as well.    ED Prescriptions     Medication Sig Dispense Auth. Provider   ondansetron (ZOFRAN-ODT) 4 MG disintegrating tablet Take 1 tablet (4 mg total) by mouth every 8 (eight) hours as needed for nausea or vomiting. 20 tablet [place], [place] E, [place]   benzonatate (TESSALON) 100 MG capsule Take 1 capsule (100 mg total) by mouth every 8 (eight) hours as needed for cough. 21 capsule  [place], [place] E, [place]   oseltamivir (TAMIFLU) 75 MG capsule Take 1 capsule (75 mg total) by mouth every 12 (twelve) hours. 10 capsule Teodora Medici, [place]      PDMP not reviewed this encounter.   Teodora Medici, [place] 02/17/21 1754

## 2021-02-17 NOTE — ED Triage Notes (Signed)
Cough, headache, nausea, generalized body aches, nasal congestion, sore throat x 2 days.

## 2021-02-18 LAB — COVID-19, FLU A+B NAA
Influenza A, NAA: NOT DETECTED
Influenza B, NAA: NOT DETECTED
SARS-CoV-2, NAA: DETECTED — AB

## 2021-04-07 DIAGNOSIS — Z1231 Encounter for screening mammogram for malignant neoplasm of breast: Secondary | ICD-10-CM | POA: Diagnosis not present

## 2021-04-09 ENCOUNTER — Encounter: Payer: Self-pay | Admitting: Nurse Practitioner

## 2021-10-13 ENCOUNTER — Ambulatory Visit (INDEPENDENT_AMBULATORY_CARE_PROVIDER_SITE_OTHER): Payer: BC Managed Care – PPO | Admitting: Family

## 2021-10-13 ENCOUNTER — Encounter: Payer: Self-pay | Admitting: Family

## 2021-10-13 VITALS — BP 136/82 | HR 72 | Temp 98.8°F | Resp 16 | Ht 65.0 in | Wt 147.0 lb

## 2021-10-13 DIAGNOSIS — E559 Vitamin D deficiency, unspecified: Secondary | ICD-10-CM

## 2021-10-13 DIAGNOSIS — Z0000 Encounter for general adult medical examination without abnormal findings: Secondary | ICD-10-CM | POA: Diagnosis not present

## 2021-10-13 DIAGNOSIS — Z1159 Encounter for screening for other viral diseases: Secondary | ICD-10-CM | POA: Diagnosis not present

## 2021-10-13 DIAGNOSIS — Z114 Encounter for screening for human immunodeficiency virus [HIV]: Secondary | ICD-10-CM

## 2021-10-13 DIAGNOSIS — R498 Other voice and resonance disorders: Secondary | ICD-10-CM

## 2021-10-13 DIAGNOSIS — Z833 Family history of diabetes mellitus: Secondary | ICD-10-CM

## 2021-10-13 LAB — COMPREHENSIVE METABOLIC PANEL
ALT: 14 U/L (ref 0–35)
AST: 14 U/L (ref 0–37)
Albumin: 4.3 g/dL (ref 3.5–5.2)
Alkaline Phosphatase: 97 U/L (ref 39–117)
BUN: 8 mg/dL (ref 6–23)
CO2: 29 mEq/L (ref 19–32)
Calcium: 9.1 mg/dL (ref 8.4–10.5)
Chloride: 102 mEq/L (ref 96–112)
Creatinine, Ser: 0.8 mg/dL (ref 0.40–1.20)
GFR: 77.92 mL/min (ref >60.00)
Glucose, Bld: 93 mg/dL (ref 70–99)
Potassium: 3.8 mEq/L (ref 3.5–5.1)
Sodium: 138 mEq/L (ref 135–145)
Total Bilirubin: 0.7 mg/dL (ref 0.2–1.2)
Total Protein: 6.6 g/dL (ref 6.0–8.3)

## 2021-10-13 LAB — VITAMIN D 25 HYDROXY (VIT D DEFICIENCY, FRACTURES): VITD: 24.26 ng/mL — ABNORMAL LOW (ref 30.00–100.00)

## 2021-10-13 NOTE — Progress Notes (Signed)
Subjective:   By signing my name below, I, Luiz Ochoa, attest that this documentation has been prepared under the direction and in the presence of Debbrah Alar, NP 10/13/2021     Patient ID: Jennifer [name], female    DOB: 03/15/1955, 66 y.o.   MRN: [id number]  Chief Complaint  Patient presents with   Annual Exam    HPI Patient is in today for comprehensive physical.  Speech Change:  She reports having difficulty pronouncing certain consonants after having Covid-19 back in January 2019. She states that her left small finger will have a slight tremor but adds that it is not frequent. Otherwise denies hand tremor.  She denies having any fever, congestion, sinus pain, sore throat, chest pain, palpitations, cough, shortness of breath, wheezing, nausea, vomiting, diarrhea, dysuria, frequency, abdominal pain, hematuria, new muscle pain, new joint pain, headaches.  Constipation: She will occasionally have constipation, but states that it is not severe.  Family History: Her father has a history of heart problems, and is diabetic.  Surgeries: No recent surgeries at this time.   Immunizations: She is UTD with her tetanus and shingle vaccinations, but she is not UTD with her Covid-19 vaccines. She has not taken any vitamin D or multivitamin supplements  Stress: She is concerned about her anxiety due to waking up at night sometimes. She adds that she has a grandchild which she notes as a good stress.   Diet:She tires to manage her diet.  Exercise: She does not do any strenuous exercise.  Dental: She plans to have a dental appointment in August.  Vision:  She plans to see the ophthalmologist this year.  Dermatologist: She has not seen the dermatologist.   Health Maintenance Due  Topic Date Due   COVID-19 Vaccine (1) Never done   Hepatitis C Screening  Never done   PAP SMEAR-Modifier  05/18/2013   INFLUENZA VACCINE  10/12/2021    Past Medical History:  Diagnosis Date    ANA positive    Anemia    Hearing loss 1962   left ear- due to measles   Thrombosis of retinal vein of left eye 1990   was on OCP at the time    Past Surgical History:  Procedure Laterality Date   BLADDER SUSPENSION  2005   [place]  2005   post repair/sling   TUBAL LIGATION  2004    Family History  Problem Relation Age of Onset   Colon polyps Mother    Heart disease Father        AFib, MVP, borderline diabetes   Hypertension Father    CVA Father    Diabetes Mellitus II Father    Colon cancer Neg Hx    Esophageal cancer Neg Hx    Rectal cancer Neg Hx    Stomach cancer Neg Hx     Social History   Socioeconomic History   Marital status: Divorced    Spouse name: Not on file   Number of children: 2   Years of education: Not on file   Highest education level: Not on file  Occupational History   Occupation: Research scientist (life sciences)  Tobacco Use   Smoking status: Never   Smokeless tobacco: Never  Vaping Use   Vaping Use: Never used  Substance and Sexual Activity   Alcohol use: Yes    Comment: ocassional- every few months   Drug use: No   Sexual activity:  Yes    Partners: Male    Birth control/protection: Surgical  Other Topics Concern   Not on file  Social History Narrative   Divorced in 2000   2 grown children- son and daughter- both local   Has a boyfriend of 70 years    Works as an Research scientist (life sciences)   Enjoys spending time at her [place].   Kayaking, walking, restaurants   No pets   Social Determinants of Health   Financial Resource Strain: Not on file  Food Insecurity: Not on file  Transportation Needs: Not on file  Physical Activity: Sufficiently Active (12/24/2019)   Exercise Vital Sign    Days of Exercise per Week: 5 days    Minutes of Exercise per Session: 70 min  Stress: Not on file  Social Connections: Not on file  Intimate Partner Violence: Not on file    Outpatient Medications Prior  to Visit  Medication Sig Dispense Refill   aspirin (ASPIRIN 81) 81 MG EC tablet Take 1 tablet (81 mg total) by mouth daily. Swallow whole. 30 tablet 12   Cholecalciferol (VITAMIN D3) 75 MCG (3000 UT) TABS Take 1 tablet by mouth daily. 30 tablet    Multiple Vitamins-Minerals (MULTIVITAMIN WITH MINERALS) tablet Take 1 tablet by mouth daily.     ondansetron (ZOFRAN-ODT) 4 MG disintegrating tablet Take 1 tablet (4 mg total) by mouth every 8 (eight) hours as needed for nausea or vomiting. 20 tablet 0   benzonatate (TESSALON) 100 MG capsule Take 1 capsule (100 mg total) by mouth every 8 (eight) hours as needed for cough. 21 capsule 0   oseltamivir (TAMIFLU) 75 MG capsule Take 1 capsule (75 mg total) by mouth every 12 (twelve) hours. 10 capsule 0   No facility-administered medications prior to visit.    Allergies  Allergen Reactions   Codeine     REACTION: Nausea and vomiting    Review of Systems  Constitutional:  Negative for fever.  HENT:  Negative for congestion, sinus pain and sore throat.   Respiratory:  Negative for cough, shortness of breath and wheezing.   Cardiovascular:  Negative for chest pain and palpitations.  Gastrointestinal:  Positive for constipation (occasionally). Negative for abdominal pain, diarrhea, nausea and vomiting.  Genitourinary:  Negative for dysuria, frequency and hematuria.  Musculoskeletal:  Negative for joint pain and myalgias.  Skin:        (-) [place]  Neurological:  Positive for tremors (slight tremor in her finger) and speech change. Negative for headaches.       Objective:    Physical Exam Constitutional:      Appearance: Normal appearance. She is not ill-appearing.  HENT:     Head: Normocephalic and atraumatic.     Right Ear: Tympanic membrane, ear canal and external ear normal.     Left Ear: Tympanic membrane, ear canal and external ear normal.  Eyes:     Extraocular Movements: Extraocular movements intact.     Right eye: No nystagmus.      Left eye: No nystagmus.     Pupils: Pupils are equal, round, and reactive to light.  Cardiovascular:     Rate and Rhythm: Normal rate and regular rhythm.     Pulses: Normal pulses.     Heart sounds: Normal heart sounds. No murmur heard.    No gallop.  Pulmonary:     Effort: Pulmonary effort is normal. No respiratory distress.     Breath sounds: Normal breath sounds. No wheezing or rales.  Abdominal:     General: Bowel sounds are normal. There is no distension.     Palpations: Abdomen is soft.     Tenderness: There is no abdominal tenderness. There is no guarding.  Musculoskeletal:     Comments: 5/5 strength in both upper and lower extremities  Lymphadenopathy:     Cervical: No cervical adenopathy.  Skin:    General: Skin is warm and dry.  Neurological:     Mental Status: She is alert and oriented to person, place, and time.     Deep Tendon Reflexes:     Reflex Scores:      Patellar reflexes are 2+ on the right side and 2+ on the left side.    Comments: Mild vocal tremor  Psychiatric:        Judgment: Judgment normal.     BP 136/82 (BP Location: Right Arm, Patient Position: Sitting, Cuff Size: Small)   Pulse 72   Temp 98.8 F (37.1 C) (Oral)   Resp 16   Ht 5' 5" (1.651 m)   Wt 147 lb (66.7 kg)   SpO2 98%   BMI 24.46 kg/m  Wt Readings from Last 3 Encounters:  10/13/21 147 lb (66.7 kg)  12/24/19 146 lb (66.2 kg)  10/23/19 148 lb (67.1 kg)        Colonoscopy- Last completed 10/11/2018. Results:  Findings:  The perianal and digital rectal examinations were normal except for small internal hemorrhoids. A diffuse area of moderate melanosis was found in the entire colon. A <1 mm polyp was found in the cecum. The polyp was sessile. The polyp was removed with a cold biopsy forceps. Resection and retrieval were complete. Estimated blood loss was minimal. The exam was otherwise without abnormality on direct and retroflexion views.  Follow up in 10 years.  Pap Smear- Last  completed 05/19/2010. Results are normal. Follow up in 3 years.  Mammogram- Last completed 04/09/2021. Results are normal. Follow up in 1-2 years.  Bone Density- Last completed 12/03/2019. Results are normal.    Assessment & Plan:   Problem List Items Addressed This Visit       Unprioritized   Vocal tremor    New. Will refer to neurology for further evaluation.       Relevant Orders   Ambulatory referral to Neurology   Preventative health care - Primary    Wt Readings from Last 3 Encounters:  10/13/21 147 lb (66.7 kg)  12/24/19 146 lb (66.2 kg)  10/23/19 148 lb (67.1 kg)  Pap smear up to date- will request copy from her GYN.  Mammo/colo up to date. Recommended that she get covid vaccine this fall. Labs as ordered.       Relevant Orders   Comp Met (CMET)   Other Visit Diagnoses     Encounter for screening for HIV       Relevant Orders   HIV antibody (with reflex)   Need for hepatitis C screening test       Relevant Orders   Hepatitis C Antibody   Vitamin D deficiency       Relevant Orders   VITAMIN D 25 Hydroxy (Vit-D Deficiency, Fractures)   Family history of diabetes mellitus       Relevant Orders   Comp Met (CMET)        No orders of the defined types were placed in this encounter.   I, Nance Pear, NP, personally preformed the services described in this documentation.  All medical record  entries made by the scribe were at my direction and in my presence.  I have reviewed the chart and discharge instructions (if applicable) and agree that the record reflects my personal performance and is accurate and complete. 10/13/2021   I,Tinashe Williams,acting as a scribe for Nance Pear, NP.,have documented all relevant documentation on the behalf of Nance Pear, NP,as directed by  Nance Pear, NP while in the presence of Nance Pear, NP.    Nance Pear, NP

## 2021-10-13 NOTE — Assessment & Plan Note (Signed)
New. Will refer to neurology for further evaluation.

## 2021-10-13 NOTE — Assessment & Plan Note (Signed)
Wt Readings from Last 3 Encounters:  10/13/21 147 lb (66.7 kg)  12/24/19 146 lb (66.2 kg)  10/23/19 148 lb (67.1 kg)   Pap smear up to date- will request copy from her GYN.  Mammo/colo up to date. Recommended that she get covid vaccine this fall. Labs as ordered.

## 2021-10-14 ENCOUNTER — Telehealth: Payer: Self-pay | Admitting: Family

## 2021-10-14 LAB — HIV ANTIBODY (ROUTINE TESTING W REFLEX): HIV 1&2 Ab, 4th Generation: NONREACTIVE

## 2021-10-14 LAB — HEPATITIS C ANTIBODY: Hepatitis C Ab: NONREACTIVE

## 2021-10-14 MED ORDER — VITAMIN D (ERGOCALCIFEROL) 1.25 MG (50000 UNIT) PO CAPS: 50000.0000 [IU] | ORAL_CAPSULE | ORAL | 0 refills | Status: DC

## 2021-10-14 NOTE — Telephone Encounter (Signed)
Vitamin D level is low.  Advise patient to begin vit D 50000 units once weekly for 12 weeks, then repeat vit D level (dx Vit D deficiency).    Hep C and HIV screening are negative.

## 2021-10-15 NOTE — Telephone Encounter (Signed)
Pt notified of results and rx.

## 2021-10-21 ENCOUNTER — Encounter: Payer: Self-pay | Admitting: Neurology

## 2021-11-30 NOTE — Progress Notes (Unsigned)
Assessment/Plan:   1.  Bulbar/pseudobulbar speech  -I did not notice any vocal tremor, but was able to note the above speech pattern.  Patient has trouble with the guttural sounds.  However, her neuro exam was other is completely normal.  There was no weakness.  There were no fasciculations.  -We will do MRI of the brain and cervical spine.  -We will do lab work, including:  B12, Tsh, Free T4, SPEP and UPEP with immunofixation, phosphorous, PTH, Copper, Lyme  -Discussed with her that we would consider EMG and neuromuscular consult.  Subjective:   Jennifer [name] was seen today in neurologic consultation at the request of Debbrah Alar, NP.  The consultation is for the evaluation of vocal tremor.   pt states that it started 4-6 months ago.  She notices trouble with some word sounds such as words with the letter L.  She has no trouble with swallowing.  Weight is stable.  Walking is good.  She is exercising.  No falls.  She doesn't feel weak.  No paresthesias.  Her GF had Parkinsons Disease but no other fam hx of Parkinsons Disease.  Some days it is not bad but other days it is bad.  She states that she feels anxious and may wake up with panic attacks.  She has no diplopia.    Neuroimaging has not previously been performed.      ALLERGIES:   Allergies  Allergen Reactions   Codeine     REACTION: Nausea and vomiting    CURRENT MEDICATIONS:  Outpatient Encounter Medications as of 12/02/2021  Medication Sig   Lactobacillus (PROBACAP) CAPS Take by mouth.   Vitamin D, Ergocalciferol, (DRISDOL) 1.25 MG (50000 UNIT) CAPS capsule Take 1 capsule (50,000 Units total) by mouth every 7 (seven) days.   No facility-administered encounter medications on file as of 12/02/2021.    Objective:   PHYSICAL EXAMINATION:    VITALS:   Vitals:   12/02/21 0926  BP: 135/81  Pulse: 68  SpO2: 96%  Weight: 151 lb 3.2 oz (68.6 kg)  Height: '5\' 5"'$  (1.651 m)    GEN:  Normal appears female in no acute  distress.  Appears stated age. HEENT:  Normocephalic, atraumatic. The mucous membranes are moist. The superficial temporal arteries are without ropiness or tenderness.  There are no tongue fasciculations. Cardiovascular: Regular rate and rhythm. Lungs: Clear to auscultation bilaterally. Neck/Heme: There are no carotid bruits noted bilaterally.  Patient is placed into examining shorts for proper examination.  NEUROLOGICAL: Orientation:  The patient is alert and oriented x 3.   Cranial nerves: There is good facial symmetry.  Extraocular muscles are intact and visual fields are full to confrontational testing. Speech is fluent and clear.  There is a bulbar quality to the speech.  She has trouble with the guttural sounds.  No spasmodic dysphonia was noted.  Soft palate rises symmetrically and there is no tongue deviation. Hearing is intact to conversational tone. Tone: Tone is good throughout. Sensation: Sensation is intact to light touch and pinprick throughout (facial, trunk, extremities). Vibration is intact at the bilateral big toe. There is no extinction with double simultaneous stimulation. There is no sensory dermatomal level identified. Coordination:  The patient has no difficulty with RAM's or FNF bilaterally. Motor: Strength is 5/5 in the bilateral upper and lower extremities.  Shoulder shrug is equal and symmetric. There is no pronator drift.  There are no fasciculations noted.  These are looked for in detail. DTR's: Deep tendon  reflexes are 2+/4 at the bilateral biceps, triceps, brachioradialis, patella and achilles.  Plantar responses are downgoing bilaterally. Gait and Station: The patient is able to ambulate without difficulty.  She is able to squat into a low position and get up without any trouble.  The patient is able to ambulate in a tandem fashion. The patient is able to stand in the Romberg position.  I have reviewed and interpreted the following labs independently Lab Results   Component Value Date   TSH 2.065 06/23/2011     Chemistry      Component Value Date/Time   NA 138 10/13/2021 0905   K 3.8 10/13/2021 0905   CL 102 10/13/2021 0905   CO2 29 10/13/2021 0905   BUN 8 10/13/2021 0905   CREATININE 0.80 10/13/2021 0905   CREATININE 0.76 10/23/2019 1502      Component Value Date/Time   CALCIUM 9.1 10/13/2021 0905   ALKPHOS 97 10/13/2021 0905   AST 14 10/13/2021 0905   ALT 14 10/13/2021 0905   BILITOT 0.7 10/13/2021 0905     No results found for: "VITAMINB12"   Total time spent on today's visit was 45 minutes, including both face-to-face time and nonface-to-face time.  Time included that spent on review of records (prior notes available to me/labs/imaging if pertinent), discussing treatment and goals, answering patient's questions and coordinating care.   Cc:  Debbrah Alar, NP

## 2021-12-02 ENCOUNTER — Ambulatory Visit: Payer: BC Managed Care – PPO | Admitting: Neurology

## 2021-12-02 ENCOUNTER — Other Ambulatory Visit (INDEPENDENT_AMBULATORY_CARE_PROVIDER_SITE_OTHER): Payer: BC Managed Care – PPO

## 2021-12-02 ENCOUNTER — Encounter: Payer: Self-pay | Admitting: Neurology

## 2021-12-02 VITALS — BP 135/81 | HR 68 | Ht 65.0 in | Wt 151.2 lb

## 2021-12-02 DIAGNOSIS — J383 Other diseases of vocal cords: Secondary | ICD-10-CM | POA: Diagnosis not present

## 2021-12-02 DIAGNOSIS — R251 Tremor, unspecified: Secondary | ICD-10-CM

## 2021-12-02 DIAGNOSIS — D509 Iron deficiency anemia, unspecified: Secondary | ICD-10-CM

## 2021-12-02 DIAGNOSIS — G609 Hereditary and idiopathic neuropathy, unspecified: Secondary | ICD-10-CM | POA: Diagnosis not present

## 2021-12-02 DIAGNOSIS — R292 Abnormal reflex: Secondary | ICD-10-CM

## 2021-12-02 LAB — PHOSPHORUS: Phosphorus: 3.9 mg/dL (ref 2.3–4.6)

## 2021-12-02 NOTE — Patient Instructions (Signed)
A referral to [facility] has been placed for your MRI someone will contact you directly to schedule your appt. They are located at [address] Please contact them directly by calling [phone] with any questions regarding your referral.  Your provider has requested that you have labwork completed today. The lab is located on the Second floor at [address], within the [facility] office. When you get off the elevator, turn right and go in the [facility] [address]; the first brown door on the left.  Tell the ladies behind the desk that you are there for lab work. If you are not called within 15 minutes please check with the front desk.   Once you complete your labs you are free to go. You will receive a call or message via MyChart with your lab results.        

## 2021-12-03 LAB — T4, FREE: Free T4: 0.89 ng/dL (ref 0.60–1.60)

## 2021-12-03 LAB — TSH: TSH: 2.44 u[IU]/mL (ref 0.35–5.50)

## 2021-12-03 LAB — VITAMIN B12: Vitamin B-12: >1500 pg/mL — ABNORMAL HIGH (ref 211–911)

## 2021-12-05 LAB — PARATHYROID HORMONE, INTACT (NO CA): PTH: 17 pg/mL (ref 16–77)

## 2021-12-05 LAB — COPPER, SERUM: Copper: 125 ug/dL (ref 70–175)

## 2021-12-06 LAB — PROTEIN ELECTROPHORESIS, SERUM
Albumin ELP: 4.1 g/dL (ref 3.8–4.8)
Alpha 1: 0.3 g/dL (ref 0.2–0.3)
Alpha 2: 0.7 g/dL (ref 0.5–0.9)
Beta 2: 0.3 g/dL (ref 0.2–0.5)
Beta Globulin: 0.4 g/dL (ref 0.4–0.6)
Gamma Globulin: 0.8 g/dL (ref 0.8–1.7)
Total Protein: 6.6 g/dL (ref 6.1–8.1)

## 2021-12-08 LAB — PROTEIN ELECTROPHORESIS, URINE REFLEX
Albumin ELP, Urine: 0 %
Alpha-1-Globulin, U: 0 %
Alpha-2-Globulin, U: 0 %
Beta Globulin, U: 0 %
Gamma Globulin, U: 0 %
Protein, Ur: <4 mg/dL

## 2021-12-08 LAB — LYME DISEASE, WESTERN BLOT
IgG P18 Ab.: ABSENT
IgG P23 Ab.: ABSENT
IgG P28 Ab.: ABSENT
IgG P30 Ab.: ABSENT
IgG P39 Ab.: ABSENT
IgG P41 Ab.: ABSENT
IgG P45 Ab.: ABSENT
IgG P58 Ab.: ABSENT
IgG P66 Ab.: ABSENT
IgG P93 Ab.: ABSENT
IgM P23 Ab.: ABSENT
IgM P39 Ab.: ABSENT
IgM P41 Ab.: ABSENT
Lyme IgG Wb: NEGATIVE
Lyme IgM Wb: NEGATIVE

## 2021-12-08 LAB — IMMUNOFIXATION, SERUM
IgA/Immunoglobulin A, Serum: 92 mg/dL (ref 87–352)
IgG (Immunoglobin G), Serum: 983 mg/dL (ref 586–1602)
IgM (Immunoglobulin M), Srm: 28 mg/dL (ref 26–217)

## 2021-12-08 LAB — IMMUNOFIXATION, URINE

## 2021-12-15 ENCOUNTER — Telehealth: Payer: Self-pay | Admitting: Neurology

## 2021-12-15 NOTE — Telephone Encounter (Signed)
Patient has an MRI of brain 10/11 4:30pm and MRI of spine 10/11 5:10pm. Patient will need valium's before these appts.  [facility] on [address]

## 2021-12-16 MED ORDER — DIAZEPAM 5 MG PO TABS: ORAL_TABLET | ORAL | 0 refills | Status: DC

## 2021-12-22 ENCOUNTER — Ambulatory Visit
Admission: RE | Admit: 2021-12-22 | Discharge: 2021-12-22 | Disposition: A | Discharge status: Home / Self Care | Payer: BC Managed Care – PPO | Source: Ambulatory Visit | Attending: Neurology | Admitting: Neurology

## 2021-12-22 DIAGNOSIS — J383 Other diseases of vocal cords: Secondary | ICD-10-CM

## 2021-12-22 DIAGNOSIS — G249 Dystonia, unspecified: Secondary | ICD-10-CM | POA: Diagnosis not present

## 2021-12-22 DIAGNOSIS — R292 Abnormal reflex: Secondary | ICD-10-CM

## 2021-12-22 DIAGNOSIS — M4802 Spinal stenosis, cervical region: Secondary | ICD-10-CM | POA: Diagnosis not present

## 2021-12-22 DIAGNOSIS — I6782 Cerebral ischemia: Secondary | ICD-10-CM | POA: Diagnosis not present

## 2021-12-22 DIAGNOSIS — M47812 Spondylosis without myelopathy or radiculopathy, cervical region: Secondary | ICD-10-CM | POA: Diagnosis not present

## 2021-12-24 ENCOUNTER — Telehealth: Payer: Self-pay | Admitting: Neurology

## 2021-12-24 DIAGNOSIS — G609 Hereditary and idiopathic neuropathy, unspecified: Secondary | ICD-10-CM

## 2021-12-24 DIAGNOSIS — Z0000 Encounter for general adult medical examination without abnormal findings: Secondary | ICD-10-CM

## 2021-12-24 DIAGNOSIS — R498 Other voice and resonance disorders: Secondary | ICD-10-CM

## 2021-12-24 DIAGNOSIS — G629 Polyneuropathy, unspecified: Secondary | ICD-10-CM

## 2021-12-24 NOTE — Telephone Encounter (Signed)
Pt called no answer left a voice mail to call the office

## 2021-12-24 NOTE — Telephone Encounter (Signed)
Pt informed that MRI cervical spine/brain don't explain her speech issues.  The next step will be emg and neuromuscular consult.  Lets have her get a consult first with Dr. Berdine Addison and then he can decide if he wants to proceed with emg. Referral placed to Dr Berdine Addison pt transferred to the front to get scheduled

## 2021-12-24 NOTE — Telephone Encounter (Signed)
Let pt know that findings on MRI cervical spine/brain don't explain her speech issues.  The next step will be emg and neuromuscular consult.  Lets have her get a consult first with Dr. Berdine Addison and then he can decide if he wants to proceed with emg

## 2021-12-28 NOTE — Progress Notes (Unsigned)
Initial neurology clinic note  SERVICE DATE: 12/30/21  Reason for Evaluation: Consultation requested by Tat, Eustace Quail, DO for an opinion regarding dysarthria. My final recommendations will be communicated back to the requesting physician by way of shared medical record or letter to requesting physician via [place] mail.  HPI: This is Ms. Jennifer [name], a 65 y.o. right-handed female with a medical history of chronic left ear hearing loss due to measles as a child, vit D deficiency, back pain with left sided sciatica who presents to neurology clinic with the chief complaint of voice changes. The patient is alone today.  About 7 months ago, patient started noticing changes in her voice. She had COVID in 03/2021. She noticed the voice a couple of months later. Per patient, at first she had great difficulty saying any words with L sound. She thinks this is getting better. She first mentioned this to her PCP in August of 2023 who also was concerned for vocal tremor. Patient was then referred to neurology.  Patient was initially seen in this office by Dr. Carles Collet on 12/02/21. Dr. Carles Collet noticed bulbar/pseudobulbar speech, but no vocal tremor. Exam was otherwise normal. MRI brain and cervical spine did not reveal a clear cause of symptoms (see full results below).  Patient has never had medications for her voice changes.  Patient denies prior neck trauma or neck surgery or any prior investigations into her neck, speech, or swallowing. She denies changes in her chewing, swallowing. She denies diplopia, ptosis, or other vision changes.   There are no neuromuscular respiratory weakness symptoms, particularly orthopnea>dyspnea.   She denies muscle pain, muscle bulk loss, or muscle weakness. She denies cramps. She denies clear fasciculations. She does mention occasional lip quivering. She also feels a nervousness inside like her insides are shaking, but no outward signs.  The patient has not noticed any recent  skin rashes nor does she report any constitutional symptoms like fever, night sweats, anorexia or unintentional weight loss.  Pseudobulbar affect is absent.  EtOH use: Weekends - a glass of wine or beer  Restrictive diet? No Family history of neuropathy/myopathy/NM disease? No. Great grandmother had some tremors as she was in her 23s.   MEDICATIONS:  Outpatient Encounter Medications as of 12/30/2021  Medication Sig   Vitamin D, Ergocalciferol, (DRISDOL) 1.25 MG (50000 UNIT) CAPS capsule Take 1 capsule (50,000 Units total) by mouth every 7 (seven) days.   diazepam (VALIUM) 5 MG tablet 1 tab 1 hour prior to procedure; may repeat 30 min prior prn (Patient not taking: Reported on 12/30/2021)   Lactobacillus (PROBACAP) CAPS Take by mouth. (Patient not taking: Reported on 12/30/2021)   No facility-administered encounter medications on file as of 12/30/2021.    PAST MEDICAL HISTORY: Past Medical History:  Diagnosis Date   ANA positive    Anemia    Hearing loss 1962   left ear- due to measles   Thrombosis of retinal vein of left eye 1990   was on OCP at the time    PAST SURGICAL HISTORY: Past Surgical History:  Procedure Laterality Date   BLADDER SUSPENSION  2005   [place]   COLONOSCOPY     TOTAL VAGINAL HYSTERECTOMY  2005   post repair/sling   TUBAL LIGATION  2004    ALLERGIES: Allergies  Allergen Reactions   Codeine     REACTION: Nausea and vomiting    FAMILY HISTORY: Family History  Problem Relation Age of Onset   Colon polyps Mother  Heart disease Father        AFib, MVP, borderline diabetes   Hypertension Father    CVA Father    Diabetes Mellitus II Father    Parkinson's disease Paternal Grandfather    Colon cancer Neg Hx    Esophageal cancer Neg Hx    Rectal cancer Neg Hx    Stomach cancer Neg Hx     SOCIAL HISTORY: Social History   Tobacco Use   Smoking status: Never   Smokeless tobacco: Never  Vaping Use   Vaping Use: Never used   Substance Use Topics   Alcohol use: Yes    Comment: every 2-3 weeks, glass of wine   Drug use: No   Social History   Social History Narrative   Divorced in 2000   2 grown children- son and daughter- both local   Has a boyfriend of 63 years    Works as an Research scientist (life sciences)   Enjoys spending time at her [place].   Kayaking, walking, restaurants   No pets   Right handed   Work Medical illustrator    Caffeine 1 cup a day     OBJECTIVE: PHYSICAL EXAM: BP (!) 149/78   Pulse 77   Ht '5\' 5"'$  (1.651 m)   Wt 151 lb (68.5 kg)   SpO2 98%   BMI 25.13 kg/m   General: General appearance: Awake and alert. No distress. Cooperative with exam.  Skin: No obvious rash or jaundice. HEENT: Atraumatic. Anicteric. Lungs: Non-labored breathing on room air  Extremities: No edema. No obvious deformity.  Musculoskeletal: No obvious joint swelling. Psych: Affect appropriate.  Neurological: Mental Status: Alert. Speech fluent. No pseudobulbar affect Cranial Nerves: CNII: No RAPD. Visual fields grossly intact. CNIII, IV, VI: PERRL. No nystagmus. EOMI. CN V: Facial sensation intact bilaterally to fine touch. Masseter clench strong. Jaw jerk negative. CN VII: Facial muscles symmetric and strong. No ptosis at rest. CN VIII: Hearing grossly intact bilaterally. CN IX: No hypophonia. CN X: Palate elevates symmetrically. CN XI: Full strength shoulder shrug bilaterally. CN XII: Tongue protrusion full and midline. No atrophy or fasciculations. Mild ?spastic dysarthria. Mild difficulty with "T" and "K" sounds Motor: Tone is normal. No fasciculations in any extremities. No atrophy.  Individual muscle group testing (MRC grade out of 5):  Movement     Neck flexion 5    Neck extension 5     Right Left   Shoulder abduction 5 5   Shoulder adduction 5 5   Shoulder ext rotation 5 5   Shoulder int rotation 5 5   Elbow flexion 5 5   Elbow extension 5 5   Wrist extension 5 5   Wrist flexion 5 5    Finger abduction - FDI 5 5   Finger abduction - ADM 5 5   Finger extension 5 5   Finger distal flexion - 2/'3 5 5   '$ Finger distal flexion - 4/'5 5 5   '$ Thumb flexion - FPL 5 5   Thumb abduction - APB 5 5    Hip flexion 5 5   Hip extension 5 5   Hip adduction 5 5   Hip abduction 5 5   Knee extension 5 5   Knee flexion 5 5   Dorsiflexion 5 5   Plantarflexion 5 5   Inversion 5 5   Eversion 5 5   Great toe extension 5 5   Great toe flexion 5 5     Reflexes:  Right  Left   Bicep 2+ 2+   Tricep 2+ 2+   BrRad 2+ 2+   Knee 2+ 2+   Ankle 2+ 2+    Pathological Reflexes: Babinski: flexor response bilaterally Hoffman: absent bilaterally Troemner: absent bilaterally Facial: absent bilaterally Midline tap: absent Sensation: Pinprick: Intact in all extremities Coordination: Intact finger-to- nose-finger bilaterally. Romberg negative. Gait: Able to rise from chair with arms crossed unassisted. Normal, narrow-based gait. Able to tandem walk. Able to walk on toes and heels.  Lab and Test Review: Internal labs: Normal or unremarkable: TSH, B12 (>1500), IFE, copper, PTH, Lyme, Hep C, HIV Vit D mildly low at 24.26  MRI brain and cervical spine wo contrast (12/22/21): FINDINGS: MRI HEAD FINDINGS   Brain: Ventricle size and cerebral volume normal. Negative for acute infarct. Susceptibility in the right posterosuperior cerebellum likely chronic hemorrhage.   Mild white matter changes with scattered small deep white matter hyperintensities bilaterally. Brainstem normal.   Vascular: Normal arterial flow voids.   Skull and upper cervical spine: No focal skeletal lesion.   Sinuses/Orbits: Mild mucosal edema paranasal sinuses. Mastoid clear. Negative orbit   Other: None   MRI CERVICAL SPINE FINDINGS   Alignment: Normal alignment with straightening of the cervical lordosis   Vertebrae: Negative for fracture or mass   Cord: Normal spinal cord signal.  No cord lesion.    Posterior Fossa, vertebral arteries, paraspinal tissues: Cerebellar tonsils above the foramen magnum. Normal arterial flow voids in the vertebral artery. Right vertebral artery dominant. No soft tissue mass in the neck.   Disc levels: C2-3: Negative   C3-4: Small central disc protrusion. Advanced facet degeneration on the left with moderate left foraminal narrowing due to spurring   C4-5: Disc degeneration with diffuse uncinate spurring. Bilateral facet hypertrophy. Moderate foraminal stenosis bilaterally   C5-6: Central and left-sided disc protrusion. Diffuse uncinate spurring. Cord flattening with moderate spinal stenosis. Moderate foraminal narrowing bilaterally due to spurring   C6-7: Disc degeneration with diffuse uncinate spurring. Mild spinal stenosis and moderate foraminal stenosis bilaterally   C7-T1: Small central disc protrusion. Negative for stenosis or neural impingement.   IMPRESSION: 1. Negative for acute infarct. Mild chronic microvascular ischemic change in the white matter. Small focus of susceptibility in the right posterior cerebellum likely chronic hemorrhage. 2. Cervical spondylosis. Moderate foraminal stenosis bilaterally at C4-5 and C5-6. Moderate spinal stenosis at C5-6. Mild spinal stenosis C6-7.  ASSESSMENT: Jennifer [name] is a 66 y.o. female who presents for evaluation of voice changes. She has a relevant medical history of chronic left ear hearing loss due to measles as a child, vit D deficiency, back pain with left sided sciatica. Her neurological examination is pertinent for mild dysarthria, more spastic, with difficulty with T and K sounds. Her exam is otherwise normal. Previous extensive work up including lab testing, MRI brain, and MRI cervical spine has not found a clear etiology. I was asked to see the patient specifically to evaluate for neuromuscular conditions such as ALS. There is no other abnormalities to suggest a neuromuscular etiology at  this time, though bulbar onset ALS or PLS remain on the differential and should be monitored for in the future. A vocal dystonia is also possible. I will send the patient to ENT for evaluation and to speech therapy to see if this will help improve her symptoms.  PLAN: -ENT referral -Speech therapy referral -May consider EMG if other work up is negative  -Return to clinic in 6 months or sooner if needed  The impression above as well as the plan as outlined below were extensively discussed with the patient who voiced understanding. All questions were answered to their satisfaction.  When available, results of the above investigations and possible further recommendations will be communicated to the patient via telephone/MyChart. Patient to call office if not contacted after expected testing turnaround time.   Total time spent reviewing records, interview, history/exam, documentation, and coordination of care on day of encounter:  70 min   Thank you for allowing me to participate in patient's care.  If I can answer any additional questions, I would be pleased to do so.  Kai Levins, MD   CC: Debbrah Alar, NP [place] [id number]  CC: Referring provider: Tat, Eustace Quail, [address]  [place],  [place] [address]

## 2021-12-30 ENCOUNTER — Encounter: Payer: Self-pay | Admitting: Neurology

## 2021-12-30 ENCOUNTER — Ambulatory Visit: Payer: BC Managed Care – PPO | Admitting: Neurology

## 2021-12-30 VITALS — BP 149/78 | HR 77 | Ht 65.0 in | Wt 151.0 lb

## 2021-12-30 DIAGNOSIS — R471 Dysarthria and anarthria: Secondary | ICD-10-CM

## 2021-12-30 DIAGNOSIS — J383 Other diseases of vocal cords: Secondary | ICD-10-CM

## 2021-12-30 DIAGNOSIS — R498 Other voice and resonance disorders: Secondary | ICD-10-CM

## 2021-12-30 NOTE — Patient Instructions (Signed)
I saw you today for speech changes. I am not sure what is causing your symptoms, but I do not see clear evidence of a neurodegenerative disease such as ALS today.  I would like to investigate further with the following: -ENT referral -Speech therapy referral  I may consider doing a muscle and nerve test called an EMG if we don't find another reason for your symptoms.  I would like to see you back in clinic in 6 months. Please let me know if you have any questions or concerns in the meantime. If you have any new or worsening symptoms, please reach out to me. I can always get you in to see me sooner.  The physicians and staff at [facility] are committed to providing excellent care. You may receive a survey requesting feedback about your experience at our office. We strive to receive "very good" responses to the survey questions. If you feel that your experience would prevent you from giving the office a "very good " response, please contact our office to try to remedy the situation. We may be reached at [phone]. Thank you for taking the time out of your busy day to complete the survey.  Kai Levins, MD [facility]

## 2022-01-05 ENCOUNTER — Encounter: Payer: Self-pay | Admitting: Neurology

## 2022-01-05 ENCOUNTER — Ambulatory Visit: Payer: BC Managed Care – PPO | Attending: Neurology | Admitting: Speech Pathology

## 2022-01-05 ENCOUNTER — Encounter: Payer: Self-pay | Admitting: Speech Pathology

## 2022-01-05 DIAGNOSIS — R490 Dysphonia: Secondary | ICD-10-CM | POA: Insufficient documentation

## 2022-01-05 DIAGNOSIS — R471 Dysarthria and anarthria: Secondary | ICD-10-CM | POA: Insufficient documentation

## 2022-01-05 NOTE — Therapy (Signed)
OUTPATIENT SPEECH LANGUAGE PATHOLOGY EVALUATION   Patient Name: Jennifer [name] MRN: [id number] DOB:02/11/1956, 66 y.o., female Today's Date: 01/05/2022  PCP: Debbrah Alar, NP REFERRING PROVIDER: Shellia Carwin, MD   End of Session - 01/05/22 1635     Visit Number 1    Number of Visits 17    Date for SLP Re-Evaluation 03/02/22    Authorization Type BCBS    SLP Start Time 0800    SLP Stop Time  0846    SLP Time Calculation (min) 46 min    Activity Tolerance Patient tolerated treatment well             Past Medical History:  Diagnosis Date   ANA positive    Anemia    Hearing loss 1962   left ear- due to measles   Thrombosis of retinal vein of left eye 1990   was on OCP at the time   Past Surgical History:  Procedure Laterality Date   BLADDER SUSPENSION  2005   [place]  2005   post repair/sling   TUBAL LIGATION  2004   Patient Active Problem List   Diagnosis Date Noted   Preventative health care 10/13/2021   Vocal tremor 10/13/2021   UTI (urinary tract infection) 06/07/2012   ANA positive     Onset date: referral 12-30-21; pt reports began ~7 months ago  REFERRING DIAG:  J38.3 (ICD-10-CM) - Spastic pseudobulbar dysphonia  R49.8 (ICD-10-CM) - Vocal tremor  R47.1 (ICD-10-CM) - Dysarthria    THERAPY DIAG:  Dysphonia  Dysarthria and anarthria  Rationale for Evaluation and Treatment Rehabilitation  SUBJECTIVE:   SUBJECTIVE STATEMENT: "My voice is shaky" Pt accompanied by: self  PERTINENT HISTORY: Per Dr. Berdine Addison PN date 12-30-21, "Her neurological examination is pertinent for mild dysarthria, more spastic, with difficulty with T and K sounds. Her exam is otherwise normal. Previous extensive work up including lab testing, MRI brain, and MRI cervical spine has not found a clear etiology. I was asked to see the patient specifically to evaluate for neuromuscular conditions such as ALS. There  is no other abnormalities to suggest a neuromuscular etiology at this time, though bulbar onset ALS or PLS remain on the differential and should be monitored for in the future. A vocal dystonia is also possible."  PAIN:  Are you having pain? No   FALLS: Has patient fallen in last 6 months? No, Number of falls: 0   PLOF: Independent  PATIENT GOALS: determine what is causing speech change and have positive change if possible   OBJECTIVE:   DIAGNOSTIC FINDINGS: 12-22-21 MR BRAIN IMPRESSION: 1. Negative for acute infarct. Mild chronic microvascular ischemic change in the white matter. Small focus of susceptibility in the right posterior cerebellum likely chronic hemorrhage. 2. Cervical spondylosis. Moderate foraminal stenosis bilaterally at C4-5 and C5-6. Moderate spinal stenosis at C5-6. Mild spinal stenosis C6-7.  COGNITION: Overall cognitive status: Within functional limits for tasks assessed Comments: pt denies changes in cognition  MOTOR SPEECH ASSESSMENT:  Rate of speech: within gross normal limits Dysfluencies: none evidenced Vocal loudness: 74 dB average in conversation, slightly louder than normal.  Articulation: within gross normal limits Resonance: normal Respiratory function: diaphragmatic/abdominal breathing Diadochokinetic rate: "tuh" and "kuh" more imprecise/slow than "puh"; puh-tuh-kuh poorly sequenced and with irregular rhythm Emotional Expression: within gross normal limits; does not evidence pseudobulbar affect or flat affect Voice quality: strained and vocal tremor ; intermittent throughout session. Pt  endorses variability within the day and day-to-day Vocal abuse:  none evidenced  nor reported by pt Vocal fatigue: pt denies Contributing factors: pt endorses variability of speech/voice but is unable to determine factors which impact change   ORAL MOTOR EXAMINATION: Overall status: WFL Comments: strength, symmetry, ROM all appear within gross normal limits;  no fasciculations  PATIENT REPORTED OUTCOME MEASURES (PROM): Deferred d/t time constraints   TODAY'S TREATMENT:  01-05-22: Education on evaluation results, SLP observations, potential differential diagnosis. Collaborated with pt to generate plan of care and goals. All pt questions answered to satisfaction, pt verbalizes appreciation for information provided during today's evaluation.    PATIENT EDUCATION: Education details: see above Person educated: Patient Education method: Customer service manager Education comprehension: verbalized understanding, returned demonstration, and needs further education  HOME EXERCISE PROGRAM: To be established initial therapy session  GOALS: Goals reviewed with patient? Yes  SHORT TERM GOALS: Target date: 02-02-2022  Pt will teach back strategies and compensations to aid in improved communication efficacy with supervision-A Baseline: Goal status: INITIAL  2.  Pt will demonstrate use of self-selected strategies to optimize intelligibility over 15 minute conversation with rare min-A over 2 sessions Baseline:  Goal status: INITIAL  3.  Pt will report daily HEP adherence over 1 week period with mod-I Baseline:  Goal status: INITIAL   LONG TERM GOALS: Target date: 03-02-2022  Pt will demonstrate improved strength of voice in 30 minute conversation through use of self-selected strategies and compensations Baseline:  Goal status: INITIAL  2.  Pt will verbalize x3 strategies which have been efficacious in improving vocal quality with rare min-A Baseline:  Goal status: INITIAL  3.  Pt will report subjective improvement in communication efficacy via patient reported outcome measure by d/c Baseline:  Goal status: INITIAL   ASSESSMENT:  CLINICAL IMPRESSION: Patient is a 66 y.o. F who was seen today for motor speech and voice assessment per referral from Dr. Berdine Addison. Pt presents with mild deviation in motor speech and voice production,  primarily characterized by intermittent vocal tremor and strained quality. SLP ? dysarthria vs spasmodic dysphonia vs functional voice etiology underlying change in speech presentation. Pt's speech is noted to be variable throughout variety of assessment means this date: narrative, repetitions, conversation, sustained "ah," diadochokinetic rate. Vocal tremor and strained quality present approximately 50% of the time. Pt reporting change in speech occurred following stressful time in life with birth issues of grandson and COVID infection. Reports to increased anxiety and stress which does appear, subjectively to pt, to impact clarity of voice/speech. SLP trials intentional distraction and pt demonstrated slight increase in vocal clarity in answering simple questions. Pt would benefit from consult with laryngologist, such as at [facility] for detailed evaluation re: laryngeal structure and functioning. Pt denies changes in swallowing, cognition, or language. Skilled ST is indicated to optimize communication efficacy.   OBJECTIVE IMPAIRMENTS: include dysarthria and voice disorder. These impairments are limiting patient from effectively communicating at home and in community. Factors affecting potential to achieve goals and functional outcome are ?co-morbidities. Patient will benefit from skilled SLP services to address above impairments and improve overall function.  REHAB POTENTIAL: Good  PLAN: SLP FREQUENCY: 2x/week  SLP DURATION: 8 weeks  PLANNED INTERVENTIONS: Cueing hierachy, SLP instruction and feedback, Compensatory strategies, and Patient/family education    Su Monks, CCC-SLP 01/05/2022, 4:37 PM

## 2022-01-06 ENCOUNTER — Other Ambulatory Visit: Payer: Self-pay | Admitting: Family

## 2022-01-06 ENCOUNTER — Telehealth: Payer: Self-pay | Admitting: Family

## 2022-01-06 DIAGNOSIS — E559 Vitamin D deficiency, unspecified: Secondary | ICD-10-CM

## 2022-01-06 NOTE — Telephone Encounter (Signed)
Saw request for vit D 50000 iu once daily. Need follow up vit D level first then we can decide best dose.

## 2022-01-11 ENCOUNTER — Other Ambulatory Visit: Payer: Self-pay

## 2022-01-12 ENCOUNTER — Ambulatory Visit: Payer: BC Managed Care – PPO | Attending: Neurology | Admitting: Speech Pathology

## 2022-01-12 ENCOUNTER — Telehealth: Payer: Self-pay

## 2022-01-12 DIAGNOSIS — R490 Dysphonia: Secondary | ICD-10-CM | POA: Diagnosis not present

## 2022-01-12 DIAGNOSIS — R471 Dysarthria and anarthria: Secondary | ICD-10-CM | POA: Insufficient documentation

## 2022-01-12 NOTE — Telephone Encounter (Signed)
Referral sent 

## 2022-01-12 NOTE — Telephone Encounter (Signed)
-----   Message from Shellia Carwin, MD sent at 01/11/2022  8:54 AM EDT ----- Regarding: Referral I spoke with the speech pathologist. She wants me to refer this patient to a specific person: "I recommend Dr. Mila Homer at [facility] She is a Environmental manager who does a wonderful job evaluating the vocal mechanism."  Can you help put in this referral?  Let me know if you have any problems with this.  [facility]

## 2022-01-12 NOTE — Therapy (Signed)
OUTPATIENT SPEECH LANGUAGE PATHOLOGY TREATMENT NOTE   Patient Name: Jennifer [name] MRN: [id number] DOB:Sep 03, 1955, 66 y.o., female Today's Date: 01/12/2022  PCP: Debbrah Alar, NP REFERRING PROVIDER: Shellia Carwin, MD     Past Medical History:  Diagnosis Date   ANA positive    Anemia    Hearing loss 1962   left ear- due to measles   Thrombosis of retinal vein of left eye 1990   was on OCP at the time   Past Surgical History:  Procedure Laterality Date   BLADDER SUSPENSION  2005   [place]  2005   post repair/sling   TUBAL LIGATION  2004   Patient Active Problem List   Diagnosis Date Noted   Preventative health care 10/13/2021   Vocal tremor 10/13/2021   UTI (urinary tract infection) 06/07/2012   ANA positive     Onset date: referral 12-30-21; pt reports began ~7 months ago  REFERRING DIAG:  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  Rationale for Evaluation and Treatment Rehabilitation  SUBJECTIVE:   SUBJECTIVE STATEMENT: "About the same, I'm having a hard time getting past the anxiety"   PAIN:  Are you having pain? No   OBJECTIVE:   TODAY'S TREATMENT:  01-12-22: Pt reports to using finger tapping as distraction, resulting in improved vocal quality. Pt tells SLP she has a lot of anxiety related to what is causing her vocal tremor. Tremor noted to wax and wane throughout today's session. SLP provided education on meditation and breath work as a tool to aid in stress reduction. Facilitated conversational speech in presence of intentional distraction, with subjective perceptual reduction observed in vocal tremor. Pt agrees to observation, finds distraction helpful tool. Pt endorses that the more she focuses on her speech, and speculates about cause, the more she notices the vocal  tremor. Updated HEP.   01-05-22: Education on evaluation results, SLP observations, potential differential diagnosis. Collaborated with pt to generate plan of care and goals. All pt questions answered to satisfaction, pt verbalizes appreciation for information provided during today's evaluation.    PATIENT EDUCATION: Education details: see above Person educated: Patient Education method: Customer service manager Education comprehension: verbalized understanding, returned demonstration, and needs further education  HOME EXERCISE PROGRAM: Intentional distraction with automatic speech exercises  GOALS: Goals reviewed with patient? Yes  SHORT TERM GOALS: Target date: 02-02-2022  Pt will teach back strategies and compensations to aid in improved communication efficacy with supervision-A Baseline: Goal status: IN PROGRESS  2.  Pt will demonstrate use of self-selected strategies to optimize intelligibility over 15 minute conversation with rare min-A over 2 sessions Baseline:  Goal status: IN PROGRESS  3.  Pt will report daily HEP adherence over 1 week period with mod-I Baseline:  Goal status: IN PROGRESS   LONG TERM GOALS: Target date: 03-02-2022  Pt will demonstrate improved strength of voice in 30 minute conversation through use of self-selected strategies and compensations Baseline:  Goal status: IN PROGRESS  2.  Pt will verbalize x3 strategies which have been efficacious in improving vocal quality with rare min-A Baseline:  Goal status: IN PROGRESS  3.  Pt will report subjective improvement in communication efficacy via patient reported outcome measure by d/c Baseline:  Goal status: IN PROGRESS   ASSESSMENT:  CLINICAL IMPRESSION: Patient is a 66 y.o.  F who was seen today for motor speech and voice assessment per referral from Dr. Berdine Addison. Pt presents with mild deviation in motor speech and voice production, primarily characterized by intermittent vocal tremor and  strained quality. SLP ? dysarthria vs spasmodic dysphonia vs functional voice etiology underlying change in speech presentation. Pt's speech is noted to be variable throughout variety of assessment means this date: narrative, repetitions, conversation, sustained "ah," diadochokinetic rate. Vocal tremor and strained quality present approximately 50% of the time. Pt reporting change in speech occurred following stressful time in life with birth issues of grandson and COVID infection. Reports to increased anxiety and stress which does appear, subjectively to pt, to impact clarity of voice/speech. SLP trials intentional distraction and pt demonstrated slight increase in vocal clarity in answering simple questions. Pt would benefit from consult with laryngologist, such as at [facility] for detailed evaluation re: laryngeal structure and functioning. Pt denies changes in swallowing, cognition, or language. Skilled ST is indicated to optimize communication efficacy.   OBJECTIVE IMPAIRMENTS: include dysarthria and voice disorder. These impairments are limiting patient from effectively communicating at home and in community. Factors affecting potential to achieve goals and functional outcome are ?co-morbidities. Patient will benefit from skilled SLP services to address above impairments and improve overall function.  REHAB POTENTIAL: Good  PLAN: SLP FREQUENCY: 2x/week  SLP DURATION: 8 weeks  PLANNED INTERVENTIONS: Cueing hierachy, SLP instruction and feedback, Compensatory strategies, and Patient/family education    Su Monks, CCC-SLP 01/12/2022, 7:51 AM

## 2022-01-13 ENCOUNTER — Ambulatory Visit: Payer: BC Managed Care – PPO | Admitting: Speech Pathology

## 2022-01-13 DIAGNOSIS — R490 Dysphonia: Secondary | ICD-10-CM

## 2022-01-13 DIAGNOSIS — R471 Dysarthria and anarthria: Secondary | ICD-10-CM

## 2022-01-13 NOTE — Therapy (Signed)
OUTPATIENT SPEECH LANGUAGE PATHOLOGY TREATMENT NOTE   Patient Name: Jennifer [name] MRN: [id number] DOB:[birth year], 66 y.o., female Today's Date: 01/13/2022  PCP: Debbrah Alar, NP REFERRING PROVIDER: Shellia Carwin, MD   End of Session - 01/13/22 0756     Visit Number 3    Number of Visits 17    Date for SLP Re-Evaluation 03/02/22    Authorization Type BC[employer]    SLP Start Time 0800    SLP Stop Time  0845    SLP Time Calculation (min) 45 min    Activity Tolerance Patient tolerated treatment well              Past Medical History:  Diagnosis Date   ANA positive    Anemia    Hearing loss 1962   left ear- due to measles   Thrombosis of retinal vein of left eye 1990   was on OCP at the time   Past Surgical History:  Procedure Laterality Date   BLADDER SUSPENSION  2005   [place]  2005   post repair/sling   TUBAL LIGATION  2004   Patient Active Problem List   Diagnosis Date Noted   Preventative health care 10/13/2021   Vocal tremor 10/13/2021   UTI (urinary tract infection) 06/07/2012   ANA positive     Onset date: referral 12-30-21; pt reports began ~7 months ago  REFERRING DIAG:  J38.3 (ICD-10-CM) - Spastic pseudobulbar dysphonia  R49.8 (ICD-10-CM) - Vocal tremor  R47.1 (ICD-10-CM) - Dysarthria    THERAPY DIAG:  Dysphonia  Dysarthria and anarthria  Rationale for Evaluation and Treatment Rehabilitation  SUBJECTIVE:   SUBJECTIVE STATEMENT: "Is there ever not a reason?" Re: vocal tremor  PAIN:  Are you having pain? No   OBJECTIVE:   TODAY'S TREATMENT:  01-13-22: SLP provides additional education on possible etiologies for vocal tremor. Reassuring that pt's tremor is inconsistent, is not having issues swallowing, unremarkable oral mechanism examination, responds well to intentional distraction, and neurologist has commented that "There is no other abnormalities to suggest a  neuromuscular etiology at this time..." SLP provides feedback to pt on speech throughout session to bring awareness to positive changes observed with distraction. Pt has implemented medication. Will plan to educate and practice semi-occluded vocal tract exercises to aid in complete and efficient VF closure for the aging larynx to enhance strength of voicing next session.   01-12-22: Pt reports to using finger tapping as distraction, resulting in improved vocal quality. Pt tells SLP she has a lot of anxiety related to what is causing her vocal tremor. Tremor noted to wax and wane throughout today's session. SLP provided education on meditation and breath work as a tool to aid in stress reduction. Facilitated conversational speech in presence of intentional distraction, with subjective perceptual reduction observed in vocal tremor. Pt agrees to observation, finds distraction helpful tool. Pt endorses that the more she focuses on her speech, and speculates about cause, the more she notices the vocal tremor. Updated HEP.   01-05-22: Education on evaluation results, SLP observations, potential differential diagnosis. Collaborated with pt to generate plan of care and goals. All pt questions answered to satisfaction, pt verbalizes appreciation for information provided during today's evaluation.    PATIENT EDUCATION: Education details: see above Person educated: Patient Education method: Customer service manager Education comprehension: verbalized understanding, returned demonstration, and needs further education  HOME EXERCISE PROGRAM: Intentional distraction with automatic speech exercises  GOALS: Goals reviewed with patient? Yes  SHORT TERM GOALS: Target date: 02-02-2022  Pt will teach back strategies and compensations to aid in improved communication efficacy with supervision-A Baseline: Goal status: IN PROGRESS  2.  Pt will demonstrate use of self-selected strategies to optimize  intelligibility over 15 minute conversation with rare min-A over 2 sessions Baseline:  Goal status: IN PROGRESS  3.  Pt will report daily HEP adherence over 1 week period with mod-I Baseline:  Goal status: IN PROGRESS   LONG TERM GOALS: Target date: 03-02-2022  Pt will demonstrate improved strength of voice in 30 minute conversation through use of self-selected strategies and compensations Baseline:  Goal status: IN PROGRESS  2.  Pt will verbalize x3 strategies which have been efficacious in improving vocal quality with rare min-A Baseline:  Goal status: IN PROGRESS  3.  Pt will report subjective improvement in communication efficacy via patient reported outcome measure by d/c Baseline:  Goal status: IN PROGRESS   ASSESSMENT:  CLINICAL IMPRESSION: Patient is a 66 y.o. F who was seen today for motor speech and voice assessment per referral from Dr. Berdine Addison. Pt presents with mild deviation in motor speech and voice production, primarily characterized by intermittent vocal tremor and strained quality. SLP ? dysarthria vs spasmodic dysphonia vs functional voice etiology underlying change in speech presentation. Pt's speech is noted to be variable throughout variety of assessment means this date: narrative, repetitions, conversation, sustained "ah," diadochokinetic rate. Vocal tremor and strained quality present approximately 50% of the time. Pt reporting change in speech occurred following stressful time in life with birth issues of grandson and COVID infection. Reports to increased anxiety and stress which does appear, subjectively to pt, to impact clarity of voice/speech. SLP trials intentional distraction and pt demonstrated slight increase in vocal clarity in answering simple questions. Pt would benefit from consult with laryngologist, such as at [facility] for detailed evaluation re: laryngeal structure and functioning. Pt denies changes in swallowing, cognition, or  language. Skilled ST is indicated to optimize communication efficacy.   OBJECTIVE IMPAIRMENTS: include dysarthria and voice disorder. These impairments are limiting patient from effectively communicating at home and in community. Factors affecting potential to achieve goals and functional outcome are ?co-morbidities. Patient will benefit from skilled SLP services to address above impairments and improve overall function.  REHAB POTENTIAL: Good  PLAN: SLP FREQUENCY: 2x/week  SLP DURATION: 8 weeks  PLANNED INTERVENTIONS: Cueing hierachy, SLP instruction and feedback, Compensatory strategies, and Patient/family education    Su Monks, CCC-SLP 01/13/2022, 7:57 AM

## 2022-01-18 ENCOUNTER — Ambulatory Visit: Payer: BC Managed Care – PPO | Admitting: Speech Pathology

## 2022-01-18 DIAGNOSIS — R490 Dysphonia: Secondary | ICD-10-CM | POA: Diagnosis not present

## 2022-01-18 DIAGNOSIS — R471 Dysarthria and anarthria: Secondary | ICD-10-CM

## 2022-01-18 NOTE — Patient Instructions (Signed)
  Semi-occluded vocal tract exercises (SOVTE)  These allow your vocal folds to vibrate without excess tension and promotes high placement of the voice  voicing through a drinking straw  Sustained vowel x10  Siren x10   Speaking (1 round of saying in deep voice, 1 round of calling out over fence) How are you?  I am fine  Where are you going?  I am going home  I am in a hurry  It's time for dinner  Intentional Distraction Practice (upping the ante)  Pick a topic - list things in the topic [place]   List the places you've lived   Say places you've been on vacation and what you liked about it  Brain strom your christmas shopping list  While you're talking on the phone- ask your communication partner if they noticed a difference

## 2022-01-18 NOTE — Therapy (Signed)
OUTPATIENT SPEECH LANGUAGE PATHOLOGY TREATMENT NOTE   Patient Name: Jennifer [name] MRN: [id number] DOB:[birth year], 66 y.o., female Today's Date: 01/18/2022  PCP: Debbrah Alar, NP REFERRING PROVIDER: Shellia Carwin, MD   End of Session - 01/18/22 0758     Visit Number 4    Number of Visits 17    Date for SLP Re-Evaluation 03/02/22    Authorization Type [employer]    SLP Start Time 0758    SLP Stop Time  0840    SLP Time Calculation (min) 42 min    Activity Tolerance Patient tolerated treatment well               Past Medical History:  Diagnosis Date   ANA positive    Anemia    Hearing loss 1962   left ear- due to measles   Thrombosis of retinal vein of left eye 1990   was on OCP at the time   Past Surgical History:  Procedure Laterality Date   BLADDER SUSPENSION  2005   [place]  2005   post repair/sling   TUBAL LIGATION  2004   Patient Active Problem List   Diagnosis Date Noted   Preventative health care 10/13/2021   Vocal tremor 10/13/2021   UTI (urinary tract infection) 06/07/2012   ANA positive     Onset date: referral 12-30-21; pt reports began ~7 months ago  REFERRING DIAG:  J38.3 (ICD-10-CM) - Spastic pseudobulbar dysphonia  R49.8 (ICD-10-CM) - Vocal tremor  R47.1 (ICD-10-CM) - Dysarthria    THERAPY DIAG:  Dysphonia  Dysarthria and anarthria  Rationale for Evaluation and Treatment Rehabilitation  SUBJECTIVE:   SUBJECTIVE STATEMENT: "Yesterday I had a really great day with my voice"   PAIN:  Are you having pain? No   OBJECTIVE:   TODAY'S TREATMENT:  01-18-22: SLP provided education on performance of semi-occluded vocal tract exercises and modified PhoRTE HEP to aid in strengthening of pt's voice. SLP provides model and feedback during pt execution of all exercises to aid in accurate completion of all targeted exercises. Pt able to demonstrate each with min-A prior to  conclusion of exercise: SOVTE with continuous phonation of vowel and sirens, PhoRTE phrases in deep voice and calling voice. Updated HEP with new exercises and increased complexity of intentional distraction exercises. Pt reports benefit of ongoing meditation practice and intentional distraction.   01-13-22: SLP provides additional education on possible etiologies for vocal tremor. Reassuring that pt's tremor is inconsistent, is not having issues swallowing, unremarkable oral mechanism examination, responds well to intentional distraction, and neurologist has commented that "There is no other abnormalities to suggest a neuromuscular etiology at this time..." SLP provides feedback to pt on speech throughout session to bring awareness to positive changes observed with distraction. Pt has implemented medication. Will plan to educate and practice semi-occluded vocal tract exercises to aid in complete and efficient VF closure for the aging larynx to enhance strength of voicing next session.   01-12-22: Pt reports to using finger tapping as distraction, resulting in improved vocal quality. Pt tells SLP she has a lot of anxiety related to what is causing her vocal tremor. Tremor noted to wax and wane throughout today's session. SLP provided education on meditation and breath work as a tool to aid in stress reduction. Facilitated conversational speech in presence of intentional distraction, with subjective perceptual reduction observed in vocal tremor. Pt agrees to observation, finds distraction helpful tool.  Pt endorses that the more she focuses on her speech, and speculates about cause, the more she notices the vocal tremor. Updated HEP.   01-05-22: Education on evaluation results, SLP observations, potential differential diagnosis. Collaborated with pt to generate plan of care and goals. All pt questions answered to satisfaction, pt verbalizes appreciation for information provided during today's evaluation.     PATIENT EDUCATION: Education details: see above Person educated: Patient Education method: Customer service manager Education comprehension: verbalized understanding, returned demonstration, and needs further education  HOME EXERCISE PROGRAM: Intentional distraction with increased complexity of tasks, SOVTE, PhoRTE  GOALS: Goals reviewed with patient? Yes  SHORT TERM GOALS: Target date: 02-02-2022  Pt will teach back strategies and compensations to aid in improved communication efficacy with supervision-A Baseline: Goal status: IN PROGRESS  2.  Pt will demonstrate use of self-selected strategies to optimize intelligibility over 15 minute conversation with rare min-A over 2 sessions Baseline:  Goal status: IN PROGRESS  3.  Pt will report daily HEP adherence over 1 week period with mod-I Baseline:  Goal status: IN PROGRESS   LONG TERM GOALS: Target date: 03-02-2022  Pt will demonstrate improved strength of voice in 30 minute conversation through use of self-selected strategies and compensations Baseline:  Goal status: IN PROGRESS  2.  Pt will verbalize x3 strategies which have been efficacious in improving vocal quality with rare min-A Baseline:  Goal status: IN PROGRESS  3.  Pt will report subjective improvement in communication efficacy via patient reported outcome measure by d/c Baseline:  Goal status: IN PROGRESS   ASSESSMENT:  CLINICAL IMPRESSION: Patient is a 66 y.o. F who was seen today for motor speech and voice assessment per referral from Dr. Berdine Addison. Pt presents with mild deviation in motor speech and voice production, primarily characterized by intermittent vocal tremor and strained quality. SLP ? dysarthria vs spasmodic dysphonia vs functional voice etiology underlying change in speech presentation. Pt's speech is noted to be variable throughout variety of assessment means this date: narrative, repetitions, conversation, sustained "ah," diadochokinetic  rate. Vocal tremor and strained quality present approximately 50% of the time. Pt reporting change in speech occurred following stressful time in life with birth issues of grandson and COVID infection. Reports to increased anxiety and stress which does appear, subjectively to pt, to impact clarity of voice/speech. SLP trials intentional distraction and pt demonstrated slight increase in vocal clarity in answering simple questions. Pt would benefit from consult with laryngologist, such as at [facility] for detailed evaluation re: laryngeal structure and functioning. Pt denies changes in swallowing, cognition, or language. Skilled ST is indicated to optimize communication efficacy.   OBJECTIVE IMPAIRMENTS: include dysarthria and voice disorder. These impairments are limiting patient from effectively communicating at home and in community. Factors affecting potential to achieve goals and functional outcome are ?co-morbidities. Patient will benefit from skilled SLP services to address above impairments and improve overall function.  REHAB POTENTIAL: Good  PLAN: SLP FREQUENCY: 2x/week  SLP DURATION: 8 weeks  PLANNED INTERVENTIONS: Cueing hierachy, SLP instruction and feedback, Compensatory strategies, and Patient/family education    Su Monks, CCC-SLP 01/18/2022, 8:40 AM

## 2022-01-21 ENCOUNTER — Ambulatory Visit: Payer: BC Managed Care – PPO | Admitting: Speech Pathology

## 2022-01-21 NOTE — Therapy (Deleted)
OUTPATIENT SPEECH LANGUAGE PATHOLOGY TREATMENT NOTE   Patient Name: Jennifer [name] MRN: [id number] DOB:04/15/1955, 66 y.o., female Today's Date: 01/21/2022  PCP: Debbrah Alar, NP REFERRING PROVIDER: Shellia Carwin, MD       Past Medical History:  Diagnosis Date   ANA positive    Anemia    Hearing loss 1962   left ear- due to measles   Thrombosis of retinal vein of left eye 1990   was on OCP at the time   Past Surgical History:  Procedure Laterality Date   BLADDER SUSPENSION  2005   [place]     TOTAL VAGINAL HYSTERECTOMY  2005   post repair/sling   TUBAL LIGATION  2004   Patient Active Problem List   Diagnosis Date Noted   Preventative health care 10/13/2021   Vocal tremor 10/13/2021   UTI (urinary tract infection) 06/07/2012   ANA positive     Onset date: referral 12-30-21; pt reports began ~7 months ago  REFERRING DIAG:  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  Rationale for Evaluation and Treatment Rehabilitation  SUBJECTIVE:   SUBJECTIVE STATEMENT: ***  PAIN:  Are you having pain? No   OBJECTIVE:   TODAY'S TREATMENT:  01-21-22: ***  01-18-22: SLP provided education on performance of semi-occluded vocal tract exercises and modified PhoRTE HEP to aid in strengthening of pt's voice. SLP provides model and feedback during pt execution of all exercises to aid in accurate completion of all targeted exercises. Pt able to demonstrate each with min-A prior to conclusion of exercise: SOVTE with continuous phonation of vowel and sirens, PhoRTE phrases in deep voice and calling voice. Updated HEP with new exercises and increased complexity of intentional distraction exercises. Pt reports benefit of ongoing meditation practice and intentional distraction.   01-13-22: SLP provides additional education on  possible etiologies for vocal tremor. Reassuring that pt's tremor is inconsistent, is not having issues swallowing, unremarkable oral mechanism examination, responds well to intentional distraction, and neurologist has commented that "There is no other abnormalities to suggest a neuromuscular etiology at this time..." SLP provides feedback to pt on speech throughout session to bring awareness to positive changes observed with distraction. Pt has implemented medication. Will plan to educate and practice semi-occluded vocal tract exercises to aid in complete and efficient VF closure for the aging larynx to enhance strength of voicing next session.   01-12-22: Pt reports to using finger tapping as distraction, resulting in improved vocal quality. Pt tells SLP she has a lot of anxiety related to what is causing her vocal tremor. Tremor noted to wax and wane throughout today's session. SLP provided education on meditation and breath work as a tool to aid in stress reduction. Facilitated conversational speech in presence of intentional distraction, with subjective perceptual reduction observed in vocal tremor. Pt agrees to observation, finds distraction helpful tool. Pt endorses that the more she focuses on her speech, and speculates about cause, the more she notices the vocal tremor. Updated HEP.   01-05-22: Education on evaluation results, SLP observations, potential differential diagnosis. Collaborated with pt to generate plan of care and goals. All pt questions answered to satisfaction, pt verbalizes appreciation for information provided during today's evaluation.    PATIENT EDUCATION: Education details: see above Person educated: Patient Education method: Customer service manager Education comprehension: verbalized understanding, returned demonstration, and needs further  education  HOME EXERCISE PROGRAM: Intentional distraction with increased complexity of tasks, SOVTE, PhoRTE  GOALS: Goals  reviewed with patient? Yes  SHORT TERM GOALS: Target date: 02-02-2022  Pt will teach back strategies and compensations to aid in improved communication efficacy with supervision-A Baseline: 01-21-22 Goal status: MET  2.  Pt will demonstrate use of self-selected strategies to optimize intelligibility over 15 minute conversation with rare min-A over 2 sessions Baseline: 01-21-22 Goal status: MET  3.  Pt will report daily HEP adherence over 1 week period with mod-I Baseline: 01-18-22, 01-21-22 Goal status: MET   LONG TERM GOALS: Target date: 03-02-2022  Pt will demonstrate improved strength of voice in 30 minute conversation through use of self-selected strategies and compensations Baseline:  Goal status: IN PROGRESS  2.  Pt will verbalize x3 strategies which have been efficacious in improving vocal quality with rare min-A Baseline:  Goal status: IN PROGRESS  3.  Pt will report subjective improvement in communication efficacy via patient reported outcome measure by d/c Baseline:  Goal status: IN PROGRESS   ASSESSMENT:  CLINICAL IMPRESSION: Patient is a 66 y.o. F who was seen today for motor speech and voice assessment per referral from Dr. Berdine Addison. Pt presents with mild deviation in motor speech and voice production, primarily characterized by intermittent vocal tremor and strained quality. SLP ? dysarthria vs spasmodic dysphonia vs functional voice etiology underlying change in speech presentation. Pt's speech is noted to be variable throughout variety of assessment means this date: narrative, repetitions, conversation, sustained "ah," diadochokinetic rate. Vocal tremor and strained quality present approximately 50% of the time. Pt reporting change in speech occurred following stressful time in life with birth issues of grandson and COVID infection. Reports to increased anxiety and stress which does appear, subjectively to pt, to impact clarity of voice/speech. SLP trials intentional  distraction and pt demonstrated slight increase in vocal clarity in answering simple questions. Pt would benefit from consult with laryngologist, such as at [facility] for detailed evaluation re: laryngeal structure and functioning. Pt denies changes in swallowing, cognition, or language. Skilled ST is indicated to optimize communication efficacy.   OBJECTIVE IMPAIRMENTS: include dysarthria and voice disorder. These impairments are limiting patient from effectively communicating at home and in community. Factors affecting potential to achieve goals and functional outcome are ?co-morbidities. Patient will benefit from skilled SLP services to address above impairments and improve overall function.  REHAB POTENTIAL: Good  PLAN: SLP FREQUENCY: 2x/week  SLP DURATION: 8 weeks  PLANNED INTERVENTIONS: Cueing hierachy, SLP instruction and feedback, Compensatory strategies, and Patient/family education    Su Monks, CCC-SLP 01/21/2022, 7:38 AM

## 2022-01-25 NOTE — Therapy (Unsigned)
OUTPATIENT SPEECH LANGUAGE PATHOLOGY TREATMENT NOTE   Patient Name: Jennifer [name] MRN: [id number] DOB:[birth year], 66 y.o., female Today's Date: 01/26/2022  PCP: Debbrah Alar, NP REFERRING PROVIDER: Shellia Carwin, MD   End of Session - 01/26/22 0757     Visit Number 5    Number of Visits 17    Date for SLP Re-Evaluation 03/02/22    Authorization Type BCBS    SLP Start Time 0800    SLP Stop Time  0843    SLP Time Calculation (min) 43 min    Activity Tolerance Patient tolerated treatment well                Past Medical History:  Diagnosis Date   ANA positive    Anemia    Hearing loss 1962   left ear- due to measles   Thrombosis of retinal vein of left eye 1990   was on OCP at the time   Past Surgical History:  Procedure Laterality Date   BLADDER SUSPENSION  2005   [place]  2005   post repair/sling   TUBAL LIGATION  2004   Patient Active Problem List   Diagnosis Date Noted   Preventative health care 10/13/2021   Vocal tremor 10/13/2021   UTI (urinary tract infection) 06/07/2012   ANA positive     Onset date: referral 12-30-21; pt reports began ~7 months ago  REFERRING DIAG:  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 it's this anxiety"  PAIN:  Are you having pain? No   OBJECTIVE:   TODAY'S TREATMENT:  01-25-22: Pt reports overall improvement in vocal quality with increased instances of strong, consistently voicing. During today's conversation, pt without vocal tremor ~60% of time. When vocal tremor present, very mild. Notable most frequently on short, 1-2 word utterances. Additional education on potential etiologies  for vocal tremor including functional speech disorder, essential tremor of the voice, other neurologic etiologies which have been explored with her neurologists. Pt has not heard from either ENT referred to, SLP provided education for testing that would be completed with ENT and benefits of understanding how the voicing mechanism is working. Pt to f/u with referral. Reviewed HEP, with pt reporting ongoing daily practice.   01-18-22: SLP provided education on performance of semi-occluded vocal tract exercises and modified PhoRTE HEP to aid in strengthening of pt's voice. SLP provides model and feedback during pt execution of all exercises to aid in accurate completion of all targeted exercises. Pt able to demonstrate each with min-A prior to conclusion of exercise: SOVTE with continuous phonation of vowel and sirens, PhoRTE phrases in deep voice and calling voice. Updated HEP with new exercises and increased complexity of intentional distraction exercises. Pt reports benefit of ongoing meditation practice and intentional distraction.   01-13-22: SLP provides additional education on possible etiologies for vocal tremor. Reassuring that pt's tremor is inconsistent, is not having issues swallowing, unremarkable oral mechanism examination, responds well to intentional distraction, and neurologist has commented that "There is no other abnormalities to suggest a neuromuscular etiology at this time..." SLP provides feedback to pt on speech throughout session to bring awareness to positive changes observed with distraction. Pt has implemented medication. Will plan  to educate and practice semi-occluded vocal tract exercises to aid in complete and efficient VF closure for the aging larynx to enhance strength of voicing next session.   01-12-22: Pt reports to using finger tapping as distraction, resulting in improved vocal quality. Pt tells SLP she has a lot of anxiety related to what is causing her vocal tremor. Tremor  noted to wax and wane throughout today's session. SLP provided education on meditation and breath work as a tool to aid in stress reduction. Facilitated conversational speech in presence of intentional distraction, with subjective perceptual reduction observed in vocal tremor. Pt agrees to observation, finds distraction helpful tool. Pt endorses that the more she focuses on her speech, and speculates about cause, the more she notices the vocal tremor. Updated HEP.   01-05-22: Education on evaluation results, SLP observations, potential differential diagnosis. Collaborated with pt to generate plan of care and goals. All pt questions answered to satisfaction, pt verbalizes appreciation for information provided during today's evaluation.    PATIENT EDUCATION: Education details: see above Person educated: Patient Education method: Customer service manager Education comprehension: verbalized understanding, returned demonstration, and needs further education  HOME EXERCISE PROGRAM: Intentional distraction with increased complexity of tasks, SOVTE, PhoRTE  GOALS: Goals reviewed with patient? Yes  SHORT TERM GOALS: Target date: 02-02-2022  Pt will teach back strategies and compensations to aid in improved communication efficacy with supervision-A Baseline: 01-21-22 Goal status: MET  2.  Pt will demonstrate use of self-selected strategies to optimize intelligibility over 15 minute conversation with rare min-A over 2 sessions Baseline: 01-21-22 Goal status: MET  3.  Pt will report daily HEP adherence over 1 week period with mod-I Baseline: 01-18-22, 01-21-22 Goal status: MET   LONG TERM GOALS: Target date: 03-02-2022  Pt will demonstrate improved strength of voice in 30 minute conversation through use of self-selected strategies and compensations Baseline:  Goal status: IN PROGRESS  2.  Pt will verbalize x3 strategies which have been efficacious in improving vocal quality with rare  min-A Baseline:  Goal status: IN PROGRESS  3.  Pt will report subjective improvement in communication efficacy via patient reported outcome measure by d/c Baseline:  Goal status: IN PROGRESS   ASSESSMENT:  CLINICAL IMPRESSION: Patient is a 66 y.o. F who was seen today for motor speech and voice assessment per referral from Dr. Berdine Addison. Pt presents with mild deviation in motor speech and voice production, primarily characterized by intermittent vocal tremor and strained quality. SLP ? dysarthria vs spasmodic dysphonia vs functional voice etiology underlying change in speech presentation. Pt's speech is noted to be variable throughout variety of assessment means this date: narrative, repetitions, conversation, sustained "ah," diadochokinetic rate. Vocal tremor and strained quality present approximately 50% of the time. Pt reporting change in speech occurred following stressful time in life with birth issues of grandson and COVID infection. Reports to increased anxiety and stress which does appear, subjectively to pt, to impact clarity of voice/speech. SLP trials intentional distraction and pt demonstrated slight increase in vocal clarity in answering simple questions. Pt would benefit from consult with laryngologist, such as at [facility] for detailed evaluation re: laryngeal structure and functioning. Pt denies changes in swallowing, cognition, or language. Skilled ST is indicated to optimize communication efficacy.   OBJECTIVE IMPAIRMENTS: include dysarthria and voice disorder. These impairments are limiting patient from effectively communicating at home and in community. Factors affecting potential to achieve goals and functional outcome are ?co-morbidities. Patient will benefit from skilled  SLP services to address above impairments and improve overall function.  REHAB POTENTIAL: Good  PLAN: SLP FREQUENCY: 2x/week  SLP DURATION: 8 weeks  PLANNED INTERVENTIONS: Cueing  hierachy, SLP instruction and feedback, Compensatory strategies, and Patient/family education    Su Monks, CCC-SLP 01/26/2022, 8:44 AM

## 2022-01-26 ENCOUNTER — Ambulatory Visit: Payer: BC Managed Care – PPO | Admitting: Speech Pathology

## 2022-01-26 DIAGNOSIS — R490 Dysphonia: Secondary | ICD-10-CM

## 2022-01-26 DIAGNOSIS — R471 Dysarthria and anarthria: Secondary | ICD-10-CM | POA: Diagnosis not present

## 2022-01-27 ENCOUNTER — Telehealth: Payer: Self-pay

## 2022-01-27 DIAGNOSIS — R471 Dysarthria and anarthria: Secondary | ICD-10-CM

## 2022-01-27 NOTE — Telephone Encounter (Signed)
-----   Message from Shellia Carwin, MD sent at 01/26/2022  9:12 AM EST ----- Regarding: Laryngologist referral Good morning all,  I am sending this to everyone since Jennifer [name] is out today. Jennifer [name] sent a referral for Jennifer [name] to a laryngologist at [facility], Dr. Mila Homer. Per patient, she never got a call, and they claim they never got our referral.  Can we try to resend? Per patient, Dr. Hazle Coca number is [phone]. The referral is for dysarthria.  Let me know if there is anything else you need.  Thank you,  Jennifer Levins, MD

## 2022-01-27 NOTE — Telephone Encounter (Signed)
I was unable to locate a referral so I have created a new referral and have faxed it over to Dr. Janey Genta Madden's office at [phone].

## 2022-01-28 ENCOUNTER — Ambulatory Visit: Payer: BC Managed Care – PPO | Admitting: Speech Pathology

## 2022-01-28 DIAGNOSIS — R490 Dysphonia: Secondary | ICD-10-CM

## 2022-01-28 DIAGNOSIS — R471 Dysarthria and anarthria: Secondary | ICD-10-CM | POA: Diagnosis not present

## 2022-01-28 NOTE — Therapy (Signed)
OUTPATIENT SPEECH LANGUAGE PATHOLOGY TREATMENT NOTE   Patient Name: Jennifer [name] MRN: [id number] DOB:Jun 29, 1955, 66 y.o., female Today's Date: 01/28/2022  PCP: Debbrah Alar, NP REFERRING PROVIDER: Shellia Carwin, MD   End of Session - 01/28/22 [phone]     Number of Visits 17    Date for SLP Re-Evaluation 03/02/22    Authorization Type [employer]    SLP Start Time [phone]    SLP Stop Time  0930    SLP Time Calculation (min) 47 min    Activity Tolerance Patient tolerated treatment well                 Past Medical History:  Diagnosis Date   ANA positive    Anemia    Hearing loss 1962   left ear- due to measles   Thrombosis of retinal vein of left eye 1990   was on OCP at the time   Past Surgical History:  Procedure Laterality Date   BLADDER SUSPENSION  2005   [place]  2005   post repair/sling   TUBAL LIGATION  2004   Patient Active Problem List   Diagnosis Date Noted   Preventative health care 10/13/2021   Vocal tremor 10/13/2021   UTI (urinary tract infection) 06/07/2012   ANA positive     Onset date: referral 12-30-21; pt reports began ~7 months ago  REFERRING DIAG:  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 No   OBJECTIVE:   TODAY'S TREATMENT:  01-28-22: SLP led pt through speech task, featuring automatic stimuli. Initial trial with typical speech, no external changes. Second trial simple distraction. Third trial moderately complex distraction. All trials recorded to listen back for subjective evaluation of change in vocal clarity. Initial two trials similar with usual vocal tremor  exhibited. Third trial with mild perceptive improvement with stronger voice and less instance of tremor. Following, pt use of forward projection and increased vocal intensity mostly negates presence of tremor over 4 minute dialogue sample. Ongoing education re: benefit of additional information able to be gained from laryngologist who is voice specialized, to inform pt and providers about potential etiology. Pt verbalizes understanding.   01-25-22: Pt reports overall improvement in vocal quality with increased instances of strong, consistently voicing. During today's conversation, pt without vocal tremor ~60% of time. When vocal tremor present, very mild. Notable most frequently on short, 1-2 word utterances. Additional education on potential etiologies for vocal tremor including functional speech disorder, essential tremor of the voice, other neurologic etiologies which have been explored with her neurologists. Pt has not heard from either ENT referred to, SLP provided education for testing that would be completed with ENT and benefits of understanding how the voicing mechanism is working. Pt to f/u with referral. Reviewed HEP, with pt reporting ongoing daily practice.   01-18-22: SLP provided education on performance of semi-occluded vocal tract exercises and modified PhoRTE HEP to aid in strengthening of pt's voice. SLP provides model and feedback during pt execution of all exercises to aid in accurate completion of all targeted exercises. Pt able to demonstrate each with min-A prior to conclusion of exercise: SOVTE with  continuous phonation of vowel and sirens, PhoRTE phrases in deep voice and calling voice. Updated HEP with new exercises and increased complexity of intentional distraction exercises. Pt reports benefit of ongoing meditation practice and intentional distraction.   01-13-22: SLP provides additional education on possible etiologies for vocal tremor. Reassuring that pt's tremor is inconsistent,  is not having issues swallowing, unremarkable oral mechanism examination, responds well to intentional distraction, and neurologist has commented that "There is no other abnormalities to suggest a neuromuscular etiology at this time..." SLP provides feedback to pt on speech throughout session to bring awareness to positive changes observed with distraction. Pt has implemented medication. Will plan to educate and practice semi-occluded vocal tract exercises to aid in complete and efficient VF closure for the aging larynx to enhance strength of voicing next session.   01-12-22: Pt reports to using finger tapping as distraction, resulting in improved vocal quality. Pt tells SLP she has a lot of anxiety related to what is causing her vocal tremor. Tremor noted to wax and wane throughout today's session. SLP provided education on meditation and breath work as a tool to aid in stress reduction. Facilitated conversational speech in presence of intentional distraction, with subjective perceptual reduction observed in vocal tremor. Pt agrees to observation, finds distraction helpful tool. Pt endorses that the more she focuses on her speech, and speculates about cause, the more she notices the vocal tremor. Updated HEP.   01-05-22: Education on evaluation results, SLP observations, potential differential diagnosis. Collaborated with pt to generate plan of care and goals. All pt questions answered to satisfaction, pt verbalizes appreciation for information provided during today's evaluation.    PATIENT EDUCATION: Education details: see above Person educated: Patient Education method: Customer service manager Education comprehension: verbalized understanding, returned demonstration, and needs further education  HOME EXERCISE PROGRAM: Intentional distraction with increased complexity of tasks, SOVTE, PhoRTE  GOALS: Goals reviewed with patient? Yes  SHORT TERM GOALS: Target date: 02-02-2022  Pt will teach  back strategies and compensations to aid in improved communication efficacy with supervision-A Baseline: 01-21-22 Goal status: MET  2.  Pt will demonstrate use of self-selected strategies to optimize intelligibility over 15 minute conversation with rare min-A over 2 sessions Baseline: 01-21-22 Goal status: MET  3.  Pt will report daily HEP adherence over 1 week period with mod-I Baseline: 01-18-22, 01-21-22 Goal status: MET   LONG TERM GOALS: Target date: 03-02-2022  Pt will demonstrate improved strength of voice in 30 minute conversation through use of self-selected strategies and compensations Baseline:  Goal status: IN PROGRESS  2.  Pt will verbalize x3 strategies which have been efficacious in improving vocal quality with rare min-A Baseline:  Goal status: IN PROGRESS  3.  Pt will report subjective improvement in communication efficacy via patient reported outcome measure by d/c Baseline:  Goal status: IN PROGRESS   ASSESSMENT:  CLINICAL IMPRESSION: Patient is a 66 y.o. F who was seen today for motor speech and voice assessment per referral from Dr. Berdine Addison. Pt presents with mild deviation in motor speech and voice production, primarily characterized by intermittent vocal tremor and strained quality. SLP ? dysarthria vs spasmodic dysphonia vs functional voice etiology underlying change in speech presentation. Pt's speech is noted to be variable throughout variety of assessment means this date: narrative, repetitions, conversation, sustained "ah," diadochokinetic rate. Vocal tremor and strained quality present approximately 50% of the time. Pt reporting change in speech occurred following stressful time in life with birth issues of grandson and COVID  infection. Reports to increased anxiety and stress which does appear, subjectively to pt, to impact clarity of voice/speech. SLP trials intentional distraction and pt demonstrated slight increase in vocal clarity in answering simple  questions. Pt would benefit from consult with laryngologist, such as at [facility] for detailed evaluation re: laryngeal structure and functioning. Pt denies changes in swallowing, cognition, or language. Skilled ST is indicated to optimize communication efficacy.   OBJECTIVE IMPAIRMENTS: include dysarthria and voice disorder. These impairments are limiting patient from effectively communicating at home and in community. Factors affecting potential to achieve goals and functional outcome are ?co-morbidities. Patient will benefit from skilled SLP services to address above impairments and improve overall function.  REHAB POTENTIAL: Good  PLAN: SLP FREQUENCY: 2x/week  SLP DURATION: 8 weeks  PLANNED INTERVENTIONS: Cueing hierachy, SLP instruction and feedback, Compensatory strategies, and Patient/family education    Su Monks, CCC-SLP 01/28/2022, 8:43 AM

## 2022-02-01 NOTE — Therapy (Unsigned)
OUTPATIENT SPEECH LANGUAGE PATHOLOGY TREATMENT NOTE   Patient Name: Jennifer [name] MRN: [id number] DOB:Aug 09, 1955, 66 y.o., female Today's Date: 02/02/2022  PCP: Debbrah Alar, NP REFERRING PROVIDER: Shellia Carwin, MD   End of Session - 02/02/22 0848     Visit Number 7    Number of Visits 17    Date for SLP Re-Evaluation 03/02/22    Authorization Type BC[employer]    SLP Start Time 0754    SLP Stop Time  0842    SLP Time Calculation (min) 48 min    Activity Tolerance Patient tolerated treatment well                  Past Medical History:  Diagnosis Date   ANA positive    Anemia    Hearing loss 1962   left ear- due to measles   Thrombosis of retinal vein of left eye 1990   was on OCP at the time   Past Surgical History:  Procedure Laterality Date   BLADDER SUSPENSION  2005   [place]  2005   post repair/sling   TUBAL LIGATION  2004   Patient Active Problem List   Diagnosis Date Noted   Preventative health care 10/13/2021   Vocal tremor 10/13/2021   UTI (urinary tract infection) 06/07/2012   ANA positive     Onset date: referral 12-30-21; pt reports began ~7 months ago  REFERRING DIAG:  J38.3 (ICD-10-CM) - Spastic pseudobulbar dysphonia  R49.8 (ICD-10-CM) - Vocal tremor  R47.1 (ICD-10-CM) - Dysarthria    THERAPY DIAG:  Dysarthria and anarthria  Dysphonia  Rationale for Evaluation and Treatment Rehabilitation  SUBJECTIVE:   SUBJECTIVE STATEMENT: "I had a really good day yesterday"  PAIN:  Are you having pain? No   OBJECTIVE:   TODAY'S TREATMENT:  02-02-22: SLP had pt read list of multi-syllabic words featuring all voiced sounds and list of multi-syllabic words with all voiceless consonants + voiced consonants. Notable clarity in word list with mixed voiceless/voiced vs 100% voiced words. Words with voiced consonants featured intermittent and brief breaks in phonation resulting  in wavering voice quality. Reading same list with use of intentional distraction pt reports subjective report of improved production, continues to carry over strategy at home/work. Pt continues to report working on managing stress/anxiety related to ambiguity of voice disorder, suspects anxiety and nerves is impacting overall perception of dystonia.   01-28-22: SLP led pt through speech task, featuring automatic stimuli. Initial trial with typical speech, no external changes. Second trial simple distraction. Third trial moderately complex distraction. All trials recorded to listen back for subjective evaluation of change in vocal clarity. Initial two trials similar with usual vocal tremor exhibited. Third trial with mild perceptive improvement with stronger voice and less instance of tremor. Following, pt use of forward projection and increased vocal intensity mostly negates presence of tremor over 4 minute dialogue sample. Ongoing education re: benefit of additional information able to be gained from laryngologist who is voice specialized, to inform pt and providers about potential etiology. Pt verbalizes understanding.      PATIENT EDUCATION: Education details: see above Person educated: Patient Education method: Customer service manager Education comprehension: verbalized understanding, returned demonstration, and needs further education  HOME EXERCISE PROGRAM: Intentional distraction with increased complexity of tasks, SOVTE, PhoRTE  GOALS: Goals reviewed with patient? Yes  SHORT TERM GOALS: Target date: 02-02-2022  Pt will teach back strategies and compensations  to aid in improved communication efficacy with supervision-A Baseline: 01-21-22 Goal status: MET  2.  Pt will demonstrate use of self-selected strategies to optimize intelligibility over 15 minute conversation with rare min-A over 2 sessions Baseline: 01-21-22 Goal status: MET  3.  Pt will report daily HEP adherence over  1 week period with mod-I Baseline: 01-18-22, 01-21-22 Goal status: MET   LONG TERM GOALS: Target date: 03-02-2022  Pt will demonstrate improved strength of voice in 30 minute conversation through use of self-selected strategies and compensations Baseline:  Goal status: IN PROGRESS  2.  Pt will verbalize x3 strategies which have been efficacious in improving vocal quality with rare min-A Baseline:  Goal status: IN PROGRESS  3.  Pt will report subjective improvement in communication efficacy via patient reported outcome measure by d/c Baseline:  Goal status: IN PROGRESS   ASSESSMENT:  CLINICAL IMPRESSION: Patient is a 66 y.o. F who was seen today for motor speech and voice assessment per referral from Dr. Berdine Addison. Pt presents with mild deviation in motor speech and voice production, primarily characterized by intermittent vocal tremor and strained quality. SLP ? dysarthria vs spasmodic dysphonia vs functional voice etiology underlying change in speech presentation. Pt's speech is noted to be variable throughout variety of assessment means this date: narrative, repetitions, conversation, sustained "ah," diadochokinetic rate. Vocal tremor and strained quality present approximately 50% of the time. Pt reporting change in speech occurred following stressful time in life with birth issues of grandson and COVID infection. Reports to increased anxiety and stress which does appear, subjectively to pt, to impact clarity of voice/speech. SLP trials intentional distraction and pt demonstrated slight increase in vocal clarity in answering simple questions. Pt would benefit from consult with laryngologist, such as at [facility] for detailed evaluation re: laryngeal structure and functioning. Pt denies changes in swallowing, cognition, or language. Skilled ST is indicated to optimize communication efficacy.   OBJECTIVE IMPAIRMENTS: include dysarthria and voice disorder. These impairments  are limiting patient from effectively communicating at home and in community. Factors affecting potential to achieve goals and functional outcome are ?co-morbidities. Patient will benefit from skilled SLP services to address above impairments and improve overall function.  REHAB POTENTIAL: Good  PLAN: SLP FREQUENCY: 2x/week  SLP DURATION: 8 weeks  PLANNED INTERVENTIONS: Cueing hierachy, SLP instruction and feedback, Compensatory strategies, and Patient/family education    Su Monks, CCC-SLP 02/02/2022, 8:48 AM

## 2022-02-02 ENCOUNTER — Ambulatory Visit: Payer: BC Managed Care – PPO | Admitting: Speech Pathology

## 2022-02-02 DIAGNOSIS — R490 Dysphonia: Secondary | ICD-10-CM

## 2022-02-02 DIAGNOSIS — R471 Dysarthria and anarthria: Secondary | ICD-10-CM | POA: Diagnosis not present

## 2022-02-09 ENCOUNTER — Ambulatory Visit: Payer: BC Managed Care – PPO | Admitting: Speech Pathology

## 2022-02-09 DIAGNOSIS — R471 Dysarthria and anarthria: Secondary | ICD-10-CM | POA: Diagnosis not present

## 2022-02-09 DIAGNOSIS — R490 Dysphonia: Secondary | ICD-10-CM | POA: Diagnosis not present

## 2022-02-09 NOTE — Therapy (Signed)
OUTPATIENT SPEECH LANGUAGE PATHOLOGY TREATMENT NOTE   Patient Name: Jennifer [name] MRN: [id number] DOB:01/06/1956, 66 y.o., female Today's Date: 02/09/2022  PCP: O'Sullivan, Melissa, NP REFERRING PROVIDER: Hill, Jeremy L, MD   End of Session - 02/09/22 0759     Visit Number 8    Number of Visits 17    Date for SLP Re-Evaluation 03/02/22    Authorization Type BCBS    SLP Start Time 0759    SLP Stop Time  0843    SLP Time Calculation (min) 44 min    Activity Tolerance Patient tolerated treatment well                   Past Medical History:  Diagnosis Date   ANA positive    Anemia    Hearing loss 1962   left ear- due to measles   Thrombosis of retinal vein of left eye 1990   was on OCP at the time   Past Surgical History:  Procedure Laterality Date   BLADDER SUSPENSION  2005   CESAREAN SECTION  1993   COLONOSCOPY     TOTAL VAGINAL HYSTERECTOMY  2005   post repair/sling   TUBAL LIGATION  2004   Patient Active Problem List   Diagnosis Date Noted   Preventative health care 10/13/2021   Vocal tremor 10/13/2021   UTI (urinary tract infection) 06/07/2012   ANA positive     Onset date: referral 12-30-21; pt reports began ~7 months ago  REFERRING DIAG:  J38.3 (ICD-10-CM) - Spastic pseudobulbar dysphonia  R49.8 (ICD-10-CM) - Vocal tremor  R47.1 (ICD-10-CM) - Dysarthria    THERAPY DIAG:  Dysarthria and anarthria  Dysphonia  Rationale for Evaluation and Treatment Rehabilitation  SUBJECTIVE:   SUBJECTIVE STATEMENT: "The worst is at night, I can't sleep because I feel the shaking in my throat"  PAIN:  Are you having pain? No   OBJECTIVE:   TODAY'S TREATMENT:  02-09-22: SLP employed blaze pods as distraction while pt complete automatic and reading speech tasks. Recorded voice to enable biofeedback of vocal quality upon conclusion of exercise. With moderately complex distraction, pt evidenced clear vocal quality with rare wavering quality to voice  at onset of speech task in reading task. Increased strength of voice and decrease in perception of shakiness observed throughout task and in playback of voice following. Pt agrees that voice sounds strong in distraction exercises. Generated strategies pt can employ to divert attention when going to bed from "shaky" feeling in throat as this is keeping pt from sleeping.   02-02-22: SLP had pt read list of multi-syllabic words featuring all voiced sounds and list of multi-syllabic words with all voiceless consonants + voiced consonants. Notable clarity in word list with mixed voiceless/voiced vs 100% voiced words. Words with voiced consonants featured intermittent and brief breaks in phonation resulting in wavering voice quality. Reading same list with use of intentional distraction pt reports subjective report of improved production, continues to carry over strategy at home/work. Pt continues to report working on managing stress/anxiety related to ambiguity of voice disorder, suspects anxiety and nerves is impacting overall perception of dystonia.   01-28-22: SLP led pt through speech task, featuring automatic stimuli. Initial trial with typical speech, no external changes. Second trial simple distraction. Third trial moderately complex distraction. All trials recorded to listen back for subjective evaluation of change in vocal clarity. Initial two trials similar with usual vocal tremor exhibited. Third trial with mild perceptive improvement with stronger voice and less instance   of tremor. Following, pt use of forward projection and increased vocal intensity mostly negates presence of tremor over 4 minute dialogue sample. Ongoing education re: benefit of additional information able to be gained from laryngologist who is voice specialized, to inform pt and providers about potential etiology. Pt verbalizes understanding.      PATIENT EDUCATION: Education details: see above Person educated: Patient Education  method: Explanation and Demonstration Education comprehension: verbalized understanding, returned demonstration, and needs further education  HOME EXERCISE PROGRAM: Intentional distraction with increased complexity of tasks, SOVTE, PhoRTE  GOALS: Goals reviewed with patient? Yes  SHORT TERM GOALS: Target date: 02-02-2022  Pt will teach back strategies and compensations to aid in improved communication efficacy with supervision-A Baseline: 01-21-22 Goal status: MET  2.  Pt will demonstrate use of self-selected strategies to optimize intelligibility over 15 minute conversation with rare min-A over 2 sessions Baseline: 01-21-22 Goal status: MET  3.  Pt will report daily HEP adherence over 1 week period with mod-I Baseline: 01-18-22, 01-21-22 Goal status: MET   LONG TERM GOALS: Target date: 03-02-2022  Pt will demonstrate improved strength of voice in 30 minute conversation through use of self-selected strategies and compensations Baseline:  Goal status: IN PROGRESS  2.  Pt will verbalize x3 strategies which have been efficacious in improving vocal quality with rare min-A Baseline:  Goal status: IN PROGRESS  3.  Pt will report subjective improvement in communication efficacy via patient reported outcome measure by d/c Baseline:  Goal status: IN PROGRESS   ASSESSMENT:  CLINICAL IMPRESSION: Patient is a 66 y.o. F who was seen today for motor speech and voice assessment per referral from Dr. Hill. Pt presents with mild deviation in motor speech and voice production, primarily characterized by intermittent vocal tremor and strained quality. SLP ? dysarthria vs spasmodic dysphonia vs functional voice etiology underlying change in speech presentation. Pt's speech is noted to be variable throughout variety of assessment means this date: narrative, repetitions, conversation, sustained "ah," diadochokinetic rate. Vocal tremor and strained quality present approximately 50% of the time. Pt  reporting change in speech occurred following stressful time in life with birth issues of grandson and COVID infection. Reports to increased anxiety and stress which does appear, subjectively to pt, to impact clarity of voice/speech. SLP trials intentional distraction and pt demonstrated slight increase in vocal clarity in answering simple questions. Pt would benefit from consult with laryngologist, such as at [facility] for detailed evaluation re: laryngeal structure and functioning. Pt denies changes in swallowing, cognition, or language. Skilled ST is indicated to optimize communication efficacy.   OBJECTIVE IMPAIRMENTS: include dysarthria and voice disorder. These impairments are limiting patient from effectively communicating at home and in community. Factors affecting potential to achieve goals and functional outcome are ?co-morbidities. Patient will benefit from skilled SLP services to address above impairments and improve overall function.  REHAB POTENTIAL: Good  PLAN: SLP FREQUENCY: 2x/week  SLP DURATION: 8 weeks  PLANNED INTERVENTIONS: Cueing hierachy, SLP instruction and feedback, Compensatory strategies, and Patient/family education    Jessica L Thomas, CCC-SLP 02/09/2022, 8:00 AM 

## 2022-02-11 ENCOUNTER — Ambulatory Visit: Payer: BC Managed Care – PPO | Attending: Neurology | Admitting: Speech Pathology

## 2022-02-11 ENCOUNTER — Telehealth: Payer: Self-pay | Admitting: Neurology

## 2022-02-11 DIAGNOSIS — R490 Dysphonia: Secondary | ICD-10-CM | POA: Diagnosis not present

## 2022-02-11 DIAGNOSIS — R471 Dysarthria and anarthria: Secondary | ICD-10-CM | POA: Insufficient documentation

## 2022-02-11 NOTE — Telephone Encounter (Signed)
Pt called in stating she was referred to 2 different places and has not heard from either of them. She called Dr. Hazle Coca office and they don't take faxed referrals, they have to have a phone call. Their phone number is [phone].

## 2022-02-11 NOTE — Telephone Encounter (Signed)
Call Pt and talked to her about the orders then I called Dr. Saralyn Pilar I could only leave a message to call our office

## 2022-02-11 NOTE — Therapy (Signed)
OUTPATIENT SPEECH LANGUAGE PATHOLOGY TREATMENT NOTE   Patient Name: Jennifer [name] MRN: [id number] DOB:[birth year], 66 y.o., female Today's Date: 02/11/2022  PCP: Debbrah Alar, NP REFERRING PROVIDER: Shellia Carwin, MD   End of Session - 02/11/22 0803     Visit Number 9    Number of Visits 17    Date for SLP Re-Evaluation 03/02/22    Authorization Type BCBS    SLP Start Time 0803    SLP Stop Time  0843    SLP Time Calculation (min) 40 min    Activity Tolerance Patient tolerated treatment well                    Past Medical History:  Diagnosis Date   ANA positive    Anemia    Hearing loss 1962   left ear- due to measles   Thrombosis of retinal vein of left eye 1990   was on OCP at the time   Past Surgical History:  Procedure Laterality Date   BLADDER SUSPENSION  2005   [place]  2005   post repair/sling   TUBAL LIGATION  2004   Patient Active Problem List   Diagnosis Date Noted   Preventative health care 10/13/2021   Vocal tremor 10/13/2021   UTI (urinary tract infection) 06/07/2012   ANA positive     Onset date: referral 12-30-21; pt reports began ~7 months ago  REFERRING DIAG:  J38.3 (ICD-10-CM) - Spastic pseudobulbar dysphonia  R49.8 (ICD-10-CM) - Vocal tremor  R47.1 (ICD-10-CM) - Dysarthria    THERAPY DIAG:  Dysarthria and anarthria  Dysphonia  Rationale for Evaluation and Treatment Rehabilitation  SUBJECTIVE:   SUBJECTIVE STATEMENT: Reports talking to niece on phone this morning and perceived voice to be strong  PAIN:  Are you having pain? No   OBJECTIVE:   TODAY'S TREATMENT:  02-11-22: Pt enters with rare, intermittent tremor observed. SLP led pt through 10 minute discourse task with pt rating voice as 7/10. Generated strategy to aid in tracking subjective perception of vocal quality over next few weeks, as pt is reporting she believes to be having overall  improvement through employment of HEP, stress management and sleep hygiene techniques, but is unsure. As voice disturbance is causing pt stress, may be beneficial for her to have reference for overall trajectory of vocal quality and ID of factors which may be contributing to observed quality. Reviewed HEP recommendations to ensure pt is able to complete per recommendations and they continue to be best fit per pt preference and work towards her goals. Pt verbalizes appreciation for interventions.   02-09-22: SLP employed blaze pods as distraction while pt complete automatic and reading speech tasks. Recorded voice to enable biofeedback of vocal quality upon conclusion of exercise. With moderately complex distraction, pt evidenced clear vocal quality with rare wavering quality to voice at onset of speech task in reading task. Increased strength of voice and decrease in perception of shakiness observed throughout task and in playback of voice following. Pt agrees that voice sounds strong in distraction exercises. Generated strategies pt can employ to divert attention when going to bed from "shaky" feeling in throat as this is keeping pt from sleeping.   02-02-22: SLP had pt read list of multi-syllabic words featuring all voiced sounds and list of multi-syllabic words with all voiceless consonants + voiced consonants. Notable clarity in word list with mixed voiceless/voiced vs 100% voiced words.  Words with voiced consonants featured intermittent and brief breaks in phonation resulting in wavering voice quality. Reading same list with use of intentional distraction pt reports subjective report of improved production, continues to carry over strategy at home/work. Pt continues to report working on managing stress/anxiety related to ambiguity of voice disorder, suspects anxiety and nerves is impacting overall perception of dystonia.   01-28-22: SLP led pt through speech task, featuring automatic stimuli. Initial  trial with typical speech, no external changes. Second trial simple distraction. Third trial moderately complex distraction. All trials recorded to listen back for subjective evaluation of change in vocal clarity. Initial two trials similar with usual vocal tremor exhibited. Third trial with mild perceptive improvement with stronger voice and less instance of tremor. Following, pt use of forward projection and increased vocal intensity mostly negates presence of tremor over 4 minute dialogue sample. Ongoing education re: benefit of additional information able to be gained from laryngologist who is voice specialized, to inform pt and providers about potential etiology. Pt verbalizes understanding.      PATIENT EDUCATION: Education details: see above Person educated: Patient Education method: Customer service manager Education comprehension: verbalized understanding, returned demonstration, and needs further education  HOME EXERCISE PROGRAM: Intentional distraction with increased complexity of tasks, SOVTE, PhoRTE  GOALS: Goals reviewed with patient? Yes  SHORT TERM GOALS: Target date: 02-02-2022  Pt will teach back strategies and compensations to aid in improved communication efficacy with supervision-A Baseline: 01-21-22 Goal status: MET  2.  Pt will demonstrate use of self-selected strategies to optimize intelligibility over 15 minute conversation with rare min-A over 2 sessions Baseline: 01-21-22 Goal status: MET  3.  Pt will report daily HEP adherence over 1 week period with mod-I Baseline: 01-18-22, 01-21-22 Goal status: MET   LONG TERM GOALS: Target date: 03-02-2022  Pt will demonstrate improved strength of voice in 30 minute conversation through use of self-selected strategies and compensations Baseline:  Goal status: IN PROGRESS  2.  Pt will verbalize x3 strategies which have been efficacious in improving vocal quality with rare min-A Baseline:  Goal status: IN  PROGRESS  3.  Pt will report subjective improvement in communication efficacy via patient reported outcome measure by d/c Baseline:  Goal status: IN PROGRESS   ASSESSMENT:  CLINICAL IMPRESSION: Patient is a 66 y.o. F who was seen today for motor speech and voice assessment per referral from Dr. Berdine Addison. Pt presents with mild deviation in motor speech and voice production, primarily characterized by intermittent vocal tremor and strained quality. SLP ? dysarthria vs spasmodic dysphonia vs functional voice etiology underlying change in speech presentation. Pt's speech is noted to be variable throughout variety of assessment means this date: narrative, repetitions, conversation, sustained "ah," diadochokinetic rate. Vocal tremor and strained quality present approximately 50% of the time. Pt reporting change in speech occurred following stressful time in life with birth issues of grandson and COVID infection. Reports to increased anxiety and stress which does appear, subjectively to pt, to impact clarity of voice/speech. SLP trials intentional distraction and pt demonstrated slight increase in vocal clarity in answering simple questions. Pt would benefit from consult with laryngologist, such as at [facility] for detailed evaluation re: laryngeal structure and functioning. Pt denies changes in swallowing, cognition, or language. Skilled ST is indicated to optimize communication efficacy.   OBJECTIVE IMPAIRMENTS: include dysarthria and voice disorder. These impairments are limiting patient from effectively communicating at home and in community. Factors affecting potential to achieve goals and  functional outcome are ?co-morbidities. Patient will benefit from skilled SLP services to address above impairments and improve overall function.  REHAB POTENTIAL: Good  PLAN: SLP FREQUENCY: 2x/week  SLP DURATION: 8 weeks  PLANNED INTERVENTIONS: Cueing hierachy, SLP instruction and feedback,  Compensatory strategies, and Patient/family education    Su Monks, CCC-SLP 02/11/2022, 8:04 AM

## 2022-02-15 ENCOUNTER — Ambulatory Visit: Payer: BC Managed Care – PPO | Admitting: Speech Pathology

## 2022-02-15 DIAGNOSIS — R471 Dysarthria and anarthria: Secondary | ICD-10-CM | POA: Diagnosis not present

## 2022-02-15 DIAGNOSIS — R490 Dysphonia: Secondary | ICD-10-CM

## 2022-02-15 NOTE — Therapy (Signed)
OUTPATIENT SPEECH LANGUAGE PATHOLOGY TREATMENT NOTE   Patient Name: Jennifer [name] MRN: [id number] DOB:April 25, 1955, 66 y.o., female Today's Date: 02/15/2022  PCP: Debbrah Alar, NP REFERRING PROVIDER: Shellia Carwin, MD   End of Session - 02/15/22 0758     Visit Number 10    Number of Visits 17    Date for SLP Re-Evaluation 03/02/22    Authorization Type BCBS    SLP Start Time 0758    SLP Stop Time  0843    SLP Time Calculation (min) 45 min    Activity Tolerance Patient tolerated treatment well                    Past Medical History:  Diagnosis Date   ANA positive    Anemia    Hearing loss 1962   left ear- due to measles   Thrombosis of retinal vein of left eye 1990   was on OCP at the time   Past Surgical History:  Procedure Laterality Date   BLADDER SUSPENSION  2005   [place]  2005   post repair/sling   TUBAL LIGATION  2004   Patient Active Problem List   Diagnosis Date Noted   Preventative health care 10/13/2021   Vocal tremor 10/13/2021   UTI (urinary tract infection) 06/07/2012   ANA positive     Onset date: referral 12-30-21; pt reports began ~7 months ago  REFERRING DIAG:  J38.3 (ICD-10-CM) - Spastic pseudobulbar dysphonia  R49.8 (ICD-10-CM) - Vocal tremor  R47.1 (ICD-10-CM) - Dysarthria    THERAPY DIAG:  Dysarthria and anarthria  Dysphonia  Rationale for Evaluation and Treatment Rehabilitation  SUBJECTIVE:   SUBJECTIVE STATEMENT: "Guess what, I have an appointment" re: ENT  PAIN:  Are you having pain? No   OBJECTIVE:   TODAY'S TREATMENT:  02-15-22: Pt with general WNL phonation this date with rare, intermittant tremor evidenced. Pt endorses ongoing strong vocal quality since prior ST visit with exception of last night which she attributes potentially to fatigue. Pt continues to support that nerves/anxiety regarding amiguity of cause of tremor as factor  impacting voice quality and perception of tremor. For example, watched education video re: etiologies for vocal tremor which alleviated some stress, supporting education SLP has provided re: potential causes for tremor, which resulted in pt perception of reduced tremor. SLP educated pt on therapeutic journaling exercise which is supported by strong evidence for assisting individuals with engagement of prefrontal cortex for reasoning through stressful situations and helping writer gain cohesive perspective. SLP advised pt of additional resources regarding the exercise and the basic tenets to complete the [place] journaling protocol. Pt verbalizes appreciation.  02-11-22: Pt enters with rare, intermittent tremor observed. SLP led pt through 10 minute discourse task with pt rating voice as 7/10. Generated strategy to aid in tracking subjective perception of vocal quality over next few weeks, as pt is reporting she believes to be having overall improvement through employment of HEP, stress management and sleep hygiene techniques, but is unsure. As voice disturbance is causing pt stress, may be beneficial for her to have reference for overall trajectory of vocal quality and ID of factors which may be contributing to observed quality. Reviewed HEP recommendations to ensure pt is able to complete per recommendations and they continue to be best fit per pt preference and work towards her goals. Pt verbalizes appreciation for interventions.   02-09-22: SLP employed blaze pods  as distraction while pt complete automatic and reading speech tasks. Recorded voice to enable biofeedback of vocal quality upon conclusion of exercise. With moderately complex distraction, pt evidenced clear vocal quality with rare wavering quality to voice at onset of speech task in reading task. Increased strength of voice and decrease in perception of shakiness observed throughout task and in playback of voice following. Pt agrees that voice  sounds strong in distraction exercises. Generated strategies pt can employ to divert attention when going to bed from "shaky" feeling in throat as this is keeping pt from sleeping.   02-02-22: SLP had pt read list of multi-syllabic words featuring all voiced sounds and list of multi-syllabic words with all voiceless consonants + voiced consonants. Notable clarity in word list with mixed voiceless/voiced vs 100% voiced words. Words with voiced consonants featured intermittent and brief breaks in phonation resulting in wavering voice quality. Reading same list with use of intentional distraction pt reports subjective report of improved production, continues to carry over strategy at home/work. Pt continues to report working on managing stress/anxiety related to ambiguity of voice disorder, suspects anxiety and nerves is impacting overall perception of dystonia.   01-28-22: SLP led pt through speech task, featuring automatic stimuli. Initial trial with typical speech, no external changes. Second trial simple distraction. Third trial moderately complex distraction. All trials recorded to listen back for subjective evaluation of change in vocal clarity. Initial two trials similar with usual vocal tremor exhibited. Third trial with mild perceptive improvement with stronger voice and less instance of tremor. Following, pt use of forward projection and increased vocal intensity mostly negates presence of tremor over 4 minute dialogue sample. Ongoing education re: benefit of additional information able to be gained from laryngologist who is voice specialized, to inform pt and providers about potential etiology. Pt verbalizes understanding.      PATIENT EDUCATION: Education details: see above Person educated: Patient Education method: Customer service manager Education comprehension: verbalized understanding, returned demonstration, and needs further education  HOME EXERCISE PROGRAM: Intentional  distraction with increased complexity of tasks, SOVTE, PhoRTE  GOALS: Goals reviewed with patient? Yes  SHORT TERM GOALS: Target date: 02-02-2022  Pt will teach back strategies and compensations to aid in improved communication efficacy with supervision-A Baseline: 01-21-22 Goal status: MET  2.  Pt will demonstrate use of self-selected strategies to optimize intelligibility over 15 minute conversation with rare min-A over 2 sessions Baseline: 01-21-22 Goal status: MET  3.  Pt will report daily HEP adherence over 1 week period with mod-I Baseline: 01-18-22, 01-21-22 Goal status: MET   LONG TERM GOALS: Target date: 03-02-2022  Pt will demonstrate improved strength of voice in 30 minute conversation through use of self-selected strategies and compensations Baseline:  Goal status: IN PROGRESS  2.  Pt will verbalize x3 strategies which have been efficacious in improving vocal quality with rare min-A Baseline:  Goal status: IN PROGRESS  3.  Pt will report subjective improvement in communication efficacy via patient reported outcome measure by d/c Baseline:  Goal status: IN PROGRESS   ASSESSMENT:  CLINICAL IMPRESSION: Patient is a 66 y.o. F who was seen today for motor speech and voice assessment per referral from Dr. Berdine Addison. Pt presents with mild deviation in motor speech and voice production, primarily characterized by intermittent vocal tremor and strained quality. SLP ? dysarthria vs spasmodic dysphonia vs functional voice etiology underlying change in speech presentation. Pt's speech is noted to be variable throughout variety of assessment means this date: narrative, repetitions,  conversation, sustained "ah," diadochokinetic rate. Vocal tremor and strained quality present approximately 50% of the time. Pt reporting change in speech occurred following stressful time in life with birth issues of grandson and COVID infection. Reports to increased anxiety and stress which does appear,  subjectively to pt, to impact clarity of voice/speech. SLP trials intentional distraction and pt demonstrated slight increase in vocal clarity in answering simple questions. Pt would benefit from consult with laryngologist, such as at [facility] for detailed evaluation re: laryngeal structure and functioning. Pt denies changes in swallowing, cognition, or language. Skilled ST is indicated to optimize communication efficacy.   OBJECTIVE IMPAIRMENTS: include dysarthria and voice disorder. These impairments are limiting patient from effectively communicating at home and in community. Factors affecting potential to achieve goals and functional outcome are ?co-morbidities. Patient will benefit from skilled SLP services to address above impairments and improve overall function.  REHAB POTENTIAL: Good  PLAN: SLP FREQUENCY: 2x/week  SLP DURATION: 8 weeks  PLANNED INTERVENTIONS: Cueing hierachy, SLP instruction and feedback, Compensatory strategies, and Patient/family education    Su Monks, CCC-SLP 02/15/2022, 7:58 AM

## 2022-02-15 NOTE — Patient Instructions (Signed)
Journaling Activity: 15 minute 4x (can be 1x a week for 4 weeks or daily for 4 days) Set a timer and write continuously about: What happened how you feel about it Connections you make to the chosen stressful situation Remember this isn't for anyone else to read-- it's just for you to work through it  This may hard during and just after the writing but over the course of the four writings you may find the "stress event" is much easier to deal with

## 2022-02-23 ENCOUNTER — Ambulatory Visit: Payer: BC Managed Care – PPO | Admitting: Neurology

## 2022-02-24 ENCOUNTER — Encounter: Payer: BC Managed Care – PPO | Admitting: Speech Pathology

## 2022-02-24 NOTE — Therapy (Signed)
OUTPATIENT SPEECH LANGUAGE PATHOLOGY TREATMENT NOTE   Patient Name: Jennifer [name] MRN: [id number] DOB:08/15/1955, 66 y.o., female Today's Date: 02/25/2022  PCP: Debbrah Alar, NP REFERRING PROVIDER: Shellia Carwin, MD   End of Session - 02/25/22 0848     Visit Number 11    Number of Visits 17    Date for SLP Re-Evaluation 03/02/22    Authorization Type BCBS    SLP Start Time 0848    SLP Stop Time  0930    SLP Time Calculation (min) 42 min    Activity Tolerance Patient tolerated treatment well                     Past Medical History:  Diagnosis Date   ANA positive    Anemia    Hearing loss 1962   left ear- due to measles   Thrombosis of retinal vein of left eye 1990   was on OCP at the time   Past Surgical History:  Procedure Laterality Date   BLADDER SUSPENSION  2005   [place]  2005   post repair/sling   TUBAL LIGATION  2004   Patient Active Problem List   Diagnosis Date Noted   Preventative health care 10/13/2021   Vocal tremor 10/13/2021   UTI (urinary tract infection) 06/07/2012   ANA positive     Onset date: referral 12-30-21; pt reports began ~7 months ago  REFERRING DIAG:  J38.3 (ICD-10-CM) - Spastic pseudobulbar dysphonia  R49.8 (ICD-10-CM) - Vocal tremor  R47.1 (ICD-10-CM) - Dysarthria    THERAPY DIAG:  Dysarthria and anarthria  Dysphonia  Rationale for Evaluation and Treatment Rehabilitation  SUBJECTIVE:   SUBJECTIVE STATEMENT: "One thing I have noticed is that it'll feel tired at the end of the day"   PAIN:  Are you having pain? No   OBJECTIVE:   TODAY'S TREATMENT:  02-25-22: Pt with strong and clear voicing for majority of session, with rare instances of mild vocal tremor, typically evidenced at onset of utterances. Pt has begun tracking voice quality with overall positive observations and reduced negative feelings regarding communication. For  example, pt used to dread answering phone at work, reports significantly decreased apprehension in answering phone. Continues to complete daily HEP. Has completed 2 journaling sessions and demonstrating improved reasoning and logical examination of process from onset of tremor to current state. Reiterated the benefits of engaging prefrontal cortex to objectively and logically evaluate event to aid in decreased stress and negative emotions which appear subjectively to have impact on vocal quality. Pt verbalizes appreciation for interventions.   02-15-22: Pt with general WNL phonation this date with rare, intermittant tremor evidenced. Pt endorses ongoing strong vocal quality since prior ST visit with exception of last night which she attributes potentially to fatigue. Pt continues to support that nerves/anxiety regarding amiguity of cause of tremor as factor impacting voice quality and perception of tremor. For example, watched education video re: etiologies for vocal tremor which alleviated some stress, supporting education SLP has provided re: potential causes for tremor, which resulted in pt perception of reduced tremor. SLP educated pt on therapeutic journaling exercise which is supported by strong evidence for assisting individuals with engagement of prefrontal cortex for reasoning through stressful situations and helping writer gain cohesive perspective. SLP advised pt of additional resources regarding the exercise and the basic tenets to complete the [place] journaling protocol. Pt verbalizes appreciation.   PATIENT  EDUCATION: Education details: see above Person educated: Patient Education method: Customer service manager Education comprehension: verbalized understanding, returned demonstration, and needs further education  HOME EXERCISE PROGRAM: Intentional distraction with increased complexity of tasks, SOVTE, PhoRTE  GOALS: Goals reviewed with patient? Yes  SHORT TERM GOALS: Target  date: 02-02-2022  Pt will teach back strategies and compensations to aid in improved communication efficacy with supervision-A Baseline: 01-21-22 Goal status: MET  2.  Pt will demonstrate use of self-selected strategies to optimize intelligibility over 15 minute conversation with rare min-A over 2 sessions Baseline: 01-21-22 Goal status: MET  3.  Pt will report daily HEP adherence over 1 week period with mod-I Baseline: 01-18-22, 01-21-22 Goal status: MET   LONG TERM GOALS: Target date: 03-02-2022  Pt will demonstrate improved strength of voice in 30 minute conversation through use of self-selected strategies and compensations Baseline: 02-25-22 Goal status: IN PROGRESS  2.  Pt will verbalize x3 strategies which have been efficacious in improving vocal quality with rare min-A Baseline: 02-25-22 Goal status: IN PROGRESS  3.  Pt will report subjective improvement in communication efficacy via patient reported outcome measure by d/c Baseline:  Goal status: IN PROGRESS   ASSESSMENT:  CLINICAL IMPRESSION: Patient is a 66 y.o. F who was seen today for motor speech and voice assessment per referral from Dr. Berdine Addison. Pt presents with mild deviation in motor speech and voice production, primarily characterized by intermittent vocal tremor and strained quality. SLP ? dysarthria vs spasmodic dysphonia vs functional voice etiology underlying change in speech presentation. Pt's speech is noted to be variable throughout variety of assessment means this date: narrative, repetitions, conversation, sustained "ah," diadochokinetic rate. Vocal tremor and strained quality present approximately 50% of the time. Pt reporting change in speech occurred following stressful time in life with birth issues of grandson and COVID infection. Reports to increased anxiety and stress which does appear, subjectively to pt, to impact clarity of voice/speech. SLP trials intentional distraction and pt demonstrated slight  increase in vocal clarity in answering simple questions. Pt would benefit from consult with laryngologist, such as at [facility] for detailed evaluation re: laryngeal structure and functioning. Pt denies changes in swallowing, cognition, or language. Skilled ST is indicated to optimize communication efficacy.   OBJECTIVE IMPAIRMENTS: include dysarthria and voice disorder. These impairments are limiting patient from effectively communicating at home and in community. Factors affecting potential to achieve goals and functional outcome are ?co-morbidities. Patient will benefit from skilled SLP services to address above impairments and improve overall function.  REHAB POTENTIAL: Good  PLAN: SLP FREQUENCY: 2x/week  SLP DURATION: 8 weeks  PLANNED INTERVENTIONS: Cueing hierachy, SLP instruction and feedback, Compensatory strategies, and Patient/family education    Su Monks, CCC-SLP 02/25/2022, 8:49 AM

## 2022-02-25 ENCOUNTER — Ambulatory Visit: Payer: BC Managed Care – PPO | Admitting: Speech Pathology

## 2022-02-25 DIAGNOSIS — R471 Dysarthria and anarthria: Secondary | ICD-10-CM

## 2022-02-25 DIAGNOSIS — R490 Dysphonia: Secondary | ICD-10-CM

## 2022-03-03 ENCOUNTER — Ambulatory Visit: Payer: BC Managed Care – PPO | Admitting: Speech Pathology

## 2022-03-03 DIAGNOSIS — R490 Dysphonia: Secondary | ICD-10-CM | POA: Diagnosis not present

## 2022-03-03 DIAGNOSIS — R471 Dysarthria and anarthria: Secondary | ICD-10-CM | POA: Diagnosis not present

## 2022-03-03 NOTE — Therapy (Signed)
OUTPATIENT SPEECH LANGUAGE PATHOLOGY TREATMENT NOTE (DISCHARGE)   Patient Name: Jennifer [name] MRN: [id number] DOB:08-08-1955, 66 y.o., female Today's Date: 03/03/2022  PCP: Debbrah Alar, NP REFERRING PROVIDER: Shellia Carwin, MD   End of Session - 03/03/22 0800     Visit Number 12    Number of Visits 17    Date for SLP Re-Evaluation 03/02/22    Authorization Type BCBS    SLP Start Time 0800    SLP Stop Time  0843    SLP Time Calculation (min) 43 min    Activity Tolerance Patient tolerated treatment well                  Past Medical History:  Diagnosis Date   ANA positive    Anemia    Hearing loss 1962   left ear- due to measles   Thrombosis of retinal vein of left eye 1990   was on OCP at the time   Past Surgical History:  Procedure Laterality Date   BLADDER SUSPENSION  2005   [place]  2005   post repair/sling   TUBAL LIGATION  2004   Patient Active Problem List   Diagnosis Date Noted   Preventative health care 10/13/2021   Vocal tremor 10/13/2021   UTI (urinary tract infection) 06/07/2012   ANA positive     Onset date: referral 12-30-21; pt reports began ~7 months ago  REFERRING DIAG:  J38.3 (ICD-10-CM) - Spastic pseudobulbar dysphonia  R49.8 (ICD-10-CM) - Vocal tremor  R47.1 (ICD-10-CM) - Dysarthria    THERAPY DIAG:  Dysarthria and anarthria  Dysphonia  Rationale for Evaluation and Treatment Rehabilitation  SUBJECTIVE:   SUBJECTIVE STATEMENT: "I had a good stretch with my voice and then yesterday had a terrible day"   PAIN:  Are you having pain? No   OBJECTIVE:   TODAY'S TREATMENT:  03-03-22: Pt enters session with slight vocal tremor ("shaky" voice) which clears throughout discussion with SLP regarding review of potential etiologies and role emotions appear to play in exacerbation of voice symptoms. Pt endorses improved clarity of voice, commiserate with SLP's  observations. Pt endorses overall subjective improvement in vocal strength and quality. Additional improvement in understanding of her symptoms and decreased feelings of stress/anxiety related to symptomology. Pt verbalizes appreciation for all interventions, continues to work on robust and comprehension HEP to target biopsychosocial features of dysphonia. Pt agreeable to ST d/c.   02-25-22: Pt with strong and clear voicing for majority of session, with rare instances of mild vocal tremor, typically evidenced at onset of utterances. Pt has begun tracking voice quality with overall positive observations and reduced negative feelings regarding communication. For example, pt used to dread answering phone at work, reports significantly decreased apprehension in answering phone. Continues to complete daily HEP. Has completed 2 journaling sessions and demonstrating improved reasoning and logical examination of process from onset of tremor to current state. Reiterated the benefits of engaging prefrontal cortex to objectively and logically evaluate event to aid in decreased stress and negative emotions which appear subjectively to have impact on vocal quality. Pt verbalizes appreciation for interventions.   02-15-22: Pt with general WNL phonation this date with rare, intermittant tremor evidenced. Pt endorses ongoing strong vocal quality since prior ST visit with exception of last night which she attributes potentially to fatigue. Pt continues to support that nerves/anxiety regarding amiguity of cause of tremor as factor impacting voice quality and perception  of tremor. For example, watched education video re: etiologies for vocal tremor which alleviated some stress, supporting education SLP has provided re: potential causes for tremor, which resulted in pt perception of reduced tremor. SLP educated pt on therapeutic journaling exercise which is supported by strong evidence for assisting individuals with engagement of  prefrontal cortex for reasoning through stressful situations and helping writer gain cohesive perspective. SLP advised pt of additional resources regarding the exercise and the basic tenets to complete the [place] journaling protocol. Pt verbalizes appreciation.   PATIENT EDUCATION: Education details: see above Person educated: Patient Education method: Customer service manager Education comprehension: verbalized understanding, returned demonstration, and needs further education  HOME EXERCISE PROGRAM: Intentional distraction with increased complexity of tasks, SOVTE, PhoRTE  GOALS: Goals reviewed with patient? Yes  SHORT TERM GOALS: Target date: 02-02-2022  Pt will teach back strategies and compensations to aid in improved communication efficacy with supervision-A Baseline: 01-21-22 Goal status: MET  2.  Pt will demonstrate use of self-selected strategies to optimize intelligibility over 15 minute conversation with rare min-A over 2 sessions Baseline: 01-21-22 Goal status: MET  3.  Pt will report daily HEP adherence over 1 week period with mod-I Baseline: 01-18-22, 01-21-22 Goal status: MET   LONG TERM GOALS: Target date: 03-02-2022  Pt will demonstrate improved strength of voice in 30 minute conversation through use of self-selected strategies and compensations Baseline: 02-25-22 Goal status: MET  2.  Pt will verbalize x3 strategies which have been efficacious in improving vocal quality with rare min-A Baseline: 02-25-22 Goal status: MET  3.  Pt will report subjective improvement in communication efficacy via patient reported outcome measure by d/c Baseline:  Goal status: MET   ASSESSMENT:  CLINICAL IMPRESSION: Pt presents with ongoing mild and intermittent deviation in motor speech and voice production, primarily characterized by intermittent vocal tremor. Resolution of stained vocal quality from baseline. Unclear etiology based on medical records,  presentation, and has ye to be seen by ENT. Despite clear etiology, pt is endorsing overall positive progression in vocal quality by managing anxiety and stress related to vocal tremor. Pt benefited from intentional distraction, demonstration of competency, relaxation/stress management techniques, and vocal exercises. Pt is agreeable to ST d/c, verbalizes appreciation for all interventions. Verbalizes understanding will need new referral should needs change.   OBJECTIVE IMPAIRMENTS: include dysarthria and voice disorder. These impairments are limiting patient from effectively communicating at home and in community. Factors affecting potential to achieve goals and functional outcome are ?co-morbidities. Patient will benefit from skilled SLP services to address above impairments and improve overall function.  REHAB POTENTIAL: Good  PLAN: SLP FREQUENCY: 2x/week  SLP DURATION: 8 weeks  PLANNED INTERVENTIONS: Cueing hierachy, SLP instruction and feedback, Compensatory strategies, and Patient/family education  SPEECH THERAPY DISCHARGE SUMMARY  Visits from Start of Care: 12  Current functional level related to goals / functional outcomes: Improved perceptive voice presentation, improved subjective evaluation of voice, increased vocal clarity and strength    Remaining deficits: dysphonia   Education / Equipment: Vocal function exercises, exuberant voice, semi occluded vocal tract exercises, lower abdominal breathing, strategies and compensations to maximize communication efficacy, HEP   Patient agrees to discharge. Patient goals were met. Patient is being discharged due to meeting the stated rehab goals.   Su Monks, Anthony 03/03/2022, 10:58 AM

## 2022-03-04 NOTE — Addendum Note (Signed)
Addended by: Su Monks on: 03/04/2022 01:46 PM   Modules accepted: Orders

## 2022-03-15 ENCOUNTER — Telehealth: Payer: Self-pay | Admitting: Neurology

## 2022-03-15 NOTE — Telephone Encounter (Signed)
Pt called in stating she was referred to Dr. Wenda Low Madden's office by Dr. Berdine Addison and she was told the incorrect diagnosis and notes were sent over and she had to be rescheduled for 03/17/22. They need the correct information to be sent to them.

## 2022-03-16 NOTE — Telephone Encounter (Signed)
Called Dr. Rowe Clack office and they sent me to Referrals that sent me back to office to speak with [facility]. I told her the concern of pt. And she said that what had happened was she was scheduled with wrong Dr. And appointment was which to Dr. Rowe Clack on the 4th of January. She originally had an appointment on the 2nd. I refax papers just to be sure

## 2022-03-16 NOTE — Telephone Encounter (Signed)
Called Pt back and informed her of the situation and she understood.

## 2022-03-17 DIAGNOSIS — J385 Laryngeal spasm: Secondary | ICD-10-CM | POA: Diagnosis not present

## 2022-03-17 DIAGNOSIS — R490 Dysphonia: Secondary | ICD-10-CM | POA: Diagnosis not present

## 2022-03-17 DIAGNOSIS — R498 Other voice and resonance disorders: Secondary | ICD-10-CM | POA: Diagnosis not present

## 2022-03-30 ENCOUNTER — Ambulatory Visit: Payer: BC Managed Care – PPO | Admitting: Family

## 2022-03-30 ENCOUNTER — Encounter: Payer: Self-pay | Admitting: Family

## 2022-03-30 VITALS — BP 132/95 | HR 81 | Temp 98.0°F | Resp 16 | Wt 161.0 lb

## 2022-03-30 DIAGNOSIS — F419 Anxiety disorder, unspecified: Secondary | ICD-10-CM | POA: Diagnosis not present

## 2022-03-30 DIAGNOSIS — Z23 Encounter for immunization: Secondary | ICD-10-CM

## 2022-03-30 DIAGNOSIS — F411 Generalized anxiety disorder: Secondary | ICD-10-CM | POA: Insufficient documentation

## 2022-03-30 DIAGNOSIS — R498 Other voice and resonance disorders: Secondary | ICD-10-CM

## 2022-03-30 DIAGNOSIS — I10 Essential (primary) hypertension: Secondary | ICD-10-CM | POA: Insufficient documentation

## 2022-03-30 DIAGNOSIS — R030 Elevated blood-pressure reading, without diagnosis of hypertension: Secondary | ICD-10-CM

## 2022-03-30 MED ORDER — SERTRALINE HCL 50 MG PO TABS: ORAL_TABLET | ORAL | 0 refills | Status: DC

## 2022-03-30 NOTE — Addendum Note (Signed)
Addended by: Jiles Prows on: 03/30/2022 11:15 AM   Modules accepted: Orders

## 2022-03-30 NOTE — Assessment & Plan Note (Signed)
Being followed by the voice center at [facility].

## 2022-03-30 NOTE — Assessment & Plan Note (Addendum)
Decreased motivation.  Increased anxiety.  Uncontrolled. Recommended that she establish with a counselor and that she start sertaline '50mg'$  (1/2 tab once daily for 1 week then increase to a full tablet once daily on week two).  Hopefully when we get her anxiety under better control her bp and insomnia will improve as well.

## 2022-03-30 NOTE — Assessment & Plan Note (Signed)
New.  Recommended that we repeat at her 1 month follow up with her anxiety is hopefully improved. If still elevated will need to consider medication therapy.

## 2022-03-30 NOTE — Progress Notes (Signed)
Subjective:   By signing my name below, I, [name], attest that this documentation has been prepared under the direction and in the presence of Debbrah Alar, NP. 03/30/2022   Patient ID: Jennifer [name], female    DOB: Mar 15, 1955, 67 y.o.   MRN: [id number]  Chief Complaint  Patient presents with   Anxiety    Complains of increased anxiety   Elevated blood preassure reading    Patient reports having an elevated BP reading at another clinic.    Anxiety Symptoms include insomnia and nervous/anxious behavior.     Patient is in today for a office visit.   Blood pressure: Her blood pressure as been measuring high recently. She is not taking any medication for it at this time. She does not check her blood pressure at home.  BP Readings from Last 3 Encounters:  03/30/22 (!) 132/95  12/30/21 (!) 149/78  12/02/21 135/81   Pulse Readings from Last 3 Encounters:  03/30/22 81  12/30/21 77  12/02/21 68   Voice tremor: She seen a neurologist for her voice tremor and was tested for ALS and anxiety. She seen the [facility] voice center and report they suspect its a voice tremor and anxiety. They have discussed laryngeal botox injections for her vocal tremor which she has not yet received as well as speech therapy.  Anxiety: She has anxiety throughout the day and night. She denies having any panic attacks. She is more wary of participating on activities she normally does due to her voice tremor. She also has anxiety over her unknown cause of voice tremor. She is recommended by the voice clinic that she see a counselor.   Insomnia: She complains of insomnia recently.    Past Medical History:  Diagnosis Date   ANA positive    Anemia    Hearing loss 1962   left ear- due to measles   Thrombosis of retinal vein of left eye 1990   was on OCP at the time    Past Surgical History:  Procedure Laterality Date   BLADDER SUSPENSION  2005   [place]      TOTAL VAGINAL HYSTERECTOMY  2005   post repair/sling   TUBAL LIGATION  2004    Family History  Problem Relation Age of Onset   Colon polyps Mother    Heart disease Father        AFib, MVP, borderline diabetes   Hypertension Father    CVA Father    Diabetes Mellitus II Father    Parkinson's disease Paternal Grandfather    Colon cancer Neg Hx    Esophageal cancer Neg Hx    Rectal cancer Neg Hx    Stomach cancer Neg Hx     Social History   Socioeconomic History   Marital status: Divorced    Spouse name: Not on file   Number of children: 2   Years of education: Not on file   Highest education level: Not on file  Occupational History   Occupation: Research scientist (life sciences)   Occupation: Medical illustrator    Comment: 3 days per week  Tobacco Use   Smoking status: Never   Smokeless tobacco: Never  Vaping Use   Vaping Use: Never used  Substance and Sexual Activity   Alcohol use: Yes    Comment: every 2-3 weeks, glass of wine   Drug use: No   Sexual activity: Yes    Partners: Male    Birth control/protection:  Surgical  Other Topics Concern   Not on file  Social History Narrative   Divorced in 2000   2 grown children- son and daughter- both local   Has a boyfriend of 68 years    Works as an Research scientist (life sciences)   Enjoys spending time at her [place].   Kayaking, walking, restaurants   No pets   Right handed   Work Medical illustrator    Caffeine 1 cup a day   Social Determinants of Health   Financial Resource Strain: Not on file  Food Insecurity: Not on file  Transportation Needs: Not on file  Physical Activity: Sufficiently Active (12/24/2019)   Exercise Vital Sign    Days of Exercise per Week: 5 days    Minutes of Exercise per Session: 70 min  Stress: Not on file  Social Connections: Not on file  Intimate Partner Violence: Not on file    Outpatient Medications Prior to Visit  Medication Sig Dispense Refill   diazepam (VALIUM) 5 MG tablet 1 tab 1 hour prior to  procedure; may repeat 30 min prior prn 3 tablet 0   Lactobacillus (PROBACAP) CAPS Take by mouth.     Vitamin D, Ergocalciferol, (DRISDOL) 1.25 MG (50000 UNIT) CAPS capsule Take 1 capsule (50,000 Units total) by mouth every 7 (seven) days. 12 capsule 0   No facility-administered medications prior to visit.    Allergies  Allergen Reactions   Codeine     REACTION: Nausea and vomiting    Review of Systems  Neurological:        (+)voice tremor  Psychiatric/Behavioral:  The patient is nervous/anxious and has insomnia.        Objective:    Physical Exam Constitutional:      General: She is not in acute distress.    Appearance: Normal appearance. She is not ill-appearing.  HENT:     Head: Normocephalic and atraumatic.     Right Ear: External ear normal.     Left Ear: External ear normal.  Eyes:     Extraocular Movements: Extraocular movements intact.     Pupils: Pupils are equal, round, and reactive to light.  Cardiovascular:     Rate and Rhythm: Normal rate.     Comments: Blood pressure measured 132/95 during manual recheck Pulmonary:     Effort: Pulmonary effort is normal.  Skin:    General: Skin is warm and dry.  Neurological:     Mental Status: She is alert and oriented to person, place, and time.     Comments: Vocal tremor noted  Psychiatric:        Judgment: Judgment normal.     BP (!) 132/95   Pulse 81   Temp 98 F (36.7 C) (Oral)   Resp 16   Wt 161 lb (73 kg)   SpO2 97%   BMI 26.79 kg/m  Wt Readings from Last 3 Encounters:  03/30/22 161 lb (73 kg)  12/30/21 151 lb (68.5 kg)  12/02/21 151 lb 3.2 oz (68.6 kg)       Assessment & Plan:  Anxiety Assessment & Plan: Decreased motivation.  Increased anxiety.  Uncontrolled. Recommended that she establish with a counselor and that she start sertaline '50mg'$  (1/2 tab once daily for 1 week then increase to a full tablet once daily on week two).  Hopefully when we get her anxiety under better control her bp and  insomnia will improve as well.     Vocal tremor Assessment & Plan: Being followed  by the voice center at [facility].    Elevated blood pressure reading Assessment & Plan: New.  Recommended that we repeat at her 1 month follow up with her anxiety is hopefully improved. If still elevated will need to consider medication therapy.    Other orders -     Sertraline HCl; 1/2 tab by mouth once daily for 1 month, may increase to a full tab once daily on week two  Dispense: 30 tablet; Refill: 0    I, Nance Pear, NP, personally preformed the services described in this documentation.  All medical record entries made by the scribe were at my direction and in my presence.  I have reviewed the chart and discharge instructions (if applicable) and agree that the record reflects my personal performance and is accurate and complete. 03/30/2022   I,[name],acting as a scribe for Nance Pear, NP.,have documented all relevant documentation on the behalf of Nance Pear, NP,as directed by  Nance Pear, NP while in the presence of Nance Pear, NP.   Nance Pear, NP

## 2022-04-05 ENCOUNTER — Encounter: Payer: Self-pay | Admitting: Family

## 2022-04-13 DIAGNOSIS — Z1231 Encounter for screening mammogram for malignant neoplasm of breast: Secondary | ICD-10-CM | POA: Diagnosis not present

## 2022-04-13 LAB — HM MAMMOGRAPHY

## 2022-04-14 ENCOUNTER — Encounter: Payer: Self-pay | Admitting: Neurology

## 2022-04-21 NOTE — Telephone Encounter (Signed)
Patient is calling in saying she would like a copy of the labs printed if possible and would be able to come pick them up tomorrow.

## 2022-05-06 DIAGNOSIS — J385 Laryngeal spasm: Secondary | ICD-10-CM | POA: Diagnosis not present

## 2022-05-06 DIAGNOSIS — R490 Dysphonia: Secondary | ICD-10-CM | POA: Diagnosis not present

## 2022-05-06 DIAGNOSIS — R498 Other voice and resonance disorders: Secondary | ICD-10-CM | POA: Diagnosis not present

## 2022-05-11 DIAGNOSIS — R258 Other abnormal involuntary movements: Secondary | ICD-10-CM | POA: Diagnosis not present

## 2022-05-11 DIAGNOSIS — R251 Tremor, unspecified: Secondary | ICD-10-CM | POA: Diagnosis not present

## 2022-06-15 DIAGNOSIS — J385 Laryngeal spasm: Secondary | ICD-10-CM | POA: Diagnosis not present

## 2022-06-15 DIAGNOSIS — R498 Other voice and resonance disorders: Secondary | ICD-10-CM | POA: Diagnosis not present

## 2022-06-15 DIAGNOSIS — R490 Dysphonia: Secondary | ICD-10-CM | POA: Diagnosis not present

## 2022-06-22 NOTE — Progress Notes (Deleted)
NEUROLOGY FOLLOW UP OFFICE NOTE  Jennifer [name] [id number]  Subjective:  Jennifer [name] is a 67 y.o. year old right-handed female with a medical history of chronic left ear hearing loss due to measles as a child, vit D deficiency, back pain with left sided sciatica who we last saw on 12/30/21.  To briefly review: About 7 months ago, patient started noticing changes in her voice. She had COVID in 03/2021. She noticed the voice a couple of months later. Per patient, at first she had great difficulty saying any words with L sound. She thinks this is getting better. She first mentioned this to her PCP in August of 2023 who also was concerned for vocal tremor. Patient was then referred to neurology.   Patient was initially seen in this office by Dr. Arbutus Leas on 12/02/21. Dr. Arbutus Leas noticed bulbar/pseudobulbar speech, but no vocal tremor. Exam was otherwise normal. MRI brain and cervical spine did not reveal a clear cause of symptoms (see full results below).   Patient has never had medications for her voice changes.   Patient denies prior neck trauma or neck surgery or any prior investigations into her neck, speech, or swallowing. She denies changes in her chewing, swallowing. She denies diplopia, ptosis, or other vision changes.    There are no neuromuscular respiratory weakness symptoms, particularly orthopnea>dyspnea.    She denies muscle pain, muscle bulk loss, or muscle weakness. She denies cramps. She denies clear fasciculations. She does mention occasional lip quivering. She also feels a nervousness inside like her insides are shaking, but no outward signs.   The patient has not noticed any recent skin rashes nor does she report any constitutional symptoms like fever, night sweats, anorexia or unintentional weight loss.   Pseudobulbar affect is absent.   EtOH use: Weekends - a glass of wine or beer  Restrictive diet? No Family history of neuropathy/myopathy/NM disease? No. Great grandmother  had some tremors as she was in her 64s.  Most recent Assessment and Plan (12/30/21): Her neurological examination is pertinent for mild dysarthria, more spastic, with difficulty with T and K sounds. Her exam is otherwise normal. Previous extensive work up including lab testing, MRI brain, and MRI cervical spine has not found a clear etiology. I was asked to see the patient specifically to evaluate for neuromuscular conditions such as ALS. There is no other abnormalities to suggest a neuromuscular etiology at this time, though bulbar onset ALS or PLS remain on the differential and should be monitored for in the future. A vocal dystonia is also possible. I will send the patient to ENT for evaluation and to speech therapy to see if this will help improve her symptoms.   PLAN: -ENT referral -Speech therapy referral -May consider EMG if other work up is negative   Since their last visit: Patient was sent to see Dr. Rubye Oaks in ENT by speech therapy who thought patient may have essential tremor. Per initial consult note from 03/17/22: ASSESSMENT: [name] is a 66 y.o. female with 1. Vocal tremor  2. Hoarseness  3. Muscle tension dysphonia  4. Laryngeal spasm   PLAN: I have recommended: Medical speech evaluation  I have recommended follow up with movement disorders neurologist for another opinion and possible trial of systemic therapy for tremor. She has not trialed any medications.   I have recommended the patient undergo: Laryngeal Botulinum toxin treatment for Voice Disorders:   She had botox for vocal tremor. ***  Patient was seen by Dr.  Siddiqui at [facility] on 05/11/22: Clinical Impression: 1) it appears to be either enhanced physiological tremor or beginning of essential tremor. Evidence supporting enhanced physiological tremor includes fluctuations in the day, high level of stress, relief with Xanax. 2) it can also be beginning of essential tremor and if that is the case it is expected  to get worse and become more prominent to make a diagnosis with more certainty. I did not find any features of dystonia in my examination  She had grade 1 bradykinesia on the left side and we will keep a close eye on any subsequent development of parkinsonism. However at this time she does not meet the criteria with no resting tremor or rigidity and a nonparkinsonian gait. She does not have any other supporting symptoms   Plan: Below plan was discussed with the patient who expressed agreement  She will take propranolol 10 mg in the evening for a week. If that is sufficient for her internal tremor at night then she will stay at 10 mg. Otherwise she can make it as 20 mg in the evening. She can also take it 10 mg twice a day. There is plenty of room to go up on propranolol if this is not helpful. Other options in future can include giving clonazepam in the evening if tremor interferes with her ability to go to sleep.  She is a very anxious person and starting an antianxiety medicine in low-dose would not be unreasonable if above measures fail  Follow-up with Ms. Green PA in 6 months  I will see her back in 1 year   MEDICATIONS:  Outpatient Encounter Medications as of 07/06/2022  Medication Sig   diazepam (VALIUM) 5 MG tablet 1 tab 1 hour prior to procedure; may repeat 30 min prior prn   Lactobacillus (PROBACAP) CAPS Take by mouth.   sertraline (ZOLOFT) 50 MG tablet 1/2 tab by mouth once daily for 1 month, may increase to a full tab once daily on week two   Vitamin D, Ergocalciferol, (DRISDOL) 1.25 MG (50000 UNIT) CAPS capsule Take 1 capsule (50,000 Units total) by mouth every 7 (seven) days.   No facility-administered encounter medications on file as of 07/06/2022.    PAST MEDICAL HISTORY: Past Medical History:  Diagnosis Date   ANA positive    Anemia    Hearing loss 1962   left ear- due to measles   Thrombosis of retinal vein of left eye 1990   was on OCP at the time    PAST SURGICAL  HISTORY: Past Surgical History:  Procedure Laterality Date   BLADDER SUSPENSION  2005   CESAREAN SECTION  1993   COLONOSCOPY     TOTAL VAGINAL HYSTERECTOMY  2005   post repair/sling   TUBAL LIGATION  2004    ALLERGIES: Allergies  Allergen Reactions   Codeine     REACTION: Nausea and vomiting    FAMILY HISTORY: Family History  Problem Relation Age of Onset   Colon polyps Mother    Heart disease Father        AFib, MVP, borderline diabetes   Hypertension Father    CVA Father    Diabetes Mellitus II Father    Parkinson's disease Paternal Grandfather    Colon cancer Neg Hx    Esophageal cancer Neg Hx    Rectal cancer Neg Hx    Stomach cancer Neg Hx     SOCIAL HISTORY: Social History   Tobacco Use   Smoking status: Never  Smokeless tobacco: Never  Vaping Use   Vaping Use: Never used  Substance Use Topics   Alcohol use: Yes    Comment: every 2-3 weeks, glass of wine   Drug use: No   Social History   Social History Narrative   Divorced in 2000   2 grown children- son and daughter- both local   Has a boyfriend of 10 years    Works as an Biomedical engineerinsurance broker   [place]   Kayaking, walking, restaurants   No pets   Right handed   Work Advertising account plannerinsurance agent    Caffeine 1 cup a day      Objective:  Vital Signs:  There were no vitals taken for this visit.  ***  Labs and Imaging review: No new results***  Previously reviewed results: Internal labs: Normal or unremarkable: TSH, B12 (>1500), IFE, copper, PTH, [place], Hep C, HIV Vit D mildly low at 24.26   MRI brain and cervical spine wo contrast (12/22/21): FINDINGS: MRI HEAD FINDINGS   Brain: Ventricle size and cerebral volume normal. Negative for acute infarct. Susceptibility in the right posterosuperior cerebellum likely chronic hemorrhage.   Mild white matter changes with scattered small deep white matter hyperintensities bilaterally. Brainstem normal.   Vascular: Normal  arterial flow voids.   Skull and upper cervical spine: No focal skeletal lesion.   Sinuses/Orbits: Mild mucosal edema paranasal sinuses. Mastoid clear. Negative orbit   Other: None   MRI CERVICAL SPINE FINDINGS   Alignment: Normal alignment with straightening of the cervical lordosis   Vertebrae: Negative for fracture or mass   Cord: Normal spinal cord signal.  No cord lesion.   Posterior Fossa, vertebral arteries, paraspinal tissues: Cerebellar tonsils above the foramen magnum. Normal arterial flow voids in the vertebral artery. Right vertebral artery dominant. No soft tissue mass in the neck.   Disc levels: C2-3: Negative   C3-4: Small central disc protrusion. Advanced facet degeneration on the left with moderate left foraminal narrowing due to spurring   C4-5: Disc degeneration with diffuse uncinate spurring. Bilateral facet hypertrophy. Moderate foraminal stenosis bilaterally   C5-6: Central and left-sided disc protrusion. Diffuse uncinate spurring. Cord flattening with moderate spinal stenosis. Moderate foraminal narrowing bilaterally due to spurring   C6-7: Disc degeneration with diffuse uncinate spurring. Mild spinal stenosis and moderate foraminal stenosis bilaterally   C7-T1: Small central disc protrusion. Negative for stenosis or neural impingement.   IMPRESSION: 1. Negative for acute infarct. Mild chronic microvascular ischemic change in the white matter. Small focus of susceptibility in the right posterior cerebellum likely chronic hemorrhage. 2. Cervical spondylosis. Moderate foraminal stenosis bilaterally at C4-5 and C5-6. Moderate spinal stenosis at C5-6. Mild spinal stenosis C6-7.  Assessment/Plan:  This is Caro HightJudith A Glauser, a 67 y.o. female with: ***   Plan: ***  Return to clinic in ***  Total time spent reviewing records, interview, history/exam, documentation, and coordination of care on day of encounter:  *** min  Jacquelyne BalintJeremy Jenyfer Trawick, MD

## 2022-07-06 ENCOUNTER — Ambulatory Visit: Payer: BC Managed Care – PPO | Admitting: Neurology

## 2022-08-12 DIAGNOSIS — R498 Other voice and resonance disorders: Secondary | ICD-10-CM | POA: Diagnosis not present

## 2022-08-12 DIAGNOSIS — J385 Laryngeal spasm: Secondary | ICD-10-CM | POA: Diagnosis not present

## 2022-09-08 DIAGNOSIS — J385 Laryngeal spasm: Secondary | ICD-10-CM | POA: Diagnosis not present

## 2022-09-08 DIAGNOSIS — R498 Other voice and resonance disorders: Secondary | ICD-10-CM | POA: Diagnosis not present

## 2022-09-08 DIAGNOSIS — R490 Dysphonia: Secondary | ICD-10-CM | POA: Diagnosis not present

## 2022-10-19 ENCOUNTER — Encounter: Payer: BC Managed Care – PPO | Admitting: Family

## 2022-11-09 DIAGNOSIS — Z79899 Other long term (current) drug therapy: Secondary | ICD-10-CM | POA: Diagnosis not present

## 2022-11-09 DIAGNOSIS — F419 Anxiety disorder, unspecified: Secondary | ICD-10-CM | POA: Diagnosis not present

## 2022-11-09 DIAGNOSIS — R498 Other voice and resonance disorders: Secondary | ICD-10-CM | POA: Diagnosis not present

## 2022-11-09 DIAGNOSIS — R251 Tremor, unspecified: Secondary | ICD-10-CM | POA: Diagnosis not present

## 2022-11-09 DIAGNOSIS — F064 Anxiety disorder due to known physiological condition: Secondary | ICD-10-CM | POA: Diagnosis not present

## 2022-11-10 DIAGNOSIS — Z1283 Encounter for screening for malignant neoplasm of skin: Secondary | ICD-10-CM | POA: Diagnosis not present

## 2022-11-10 DIAGNOSIS — L820 Inflamed seborrheic keratosis: Secondary | ICD-10-CM | POA: Diagnosis not present

## 2022-11-10 DIAGNOSIS — D225 Melanocytic nevi of trunk: Secondary | ICD-10-CM | POA: Diagnosis not present

## 2023-02-01 DIAGNOSIS — Z135 Encounter for screening for eye and ear disorders: Secondary | ICD-10-CM | POA: Diagnosis not present

## 2023-02-01 DIAGNOSIS — H5213 Myopia, bilateral: Secondary | ICD-10-CM | POA: Diagnosis not present

## 2023-02-01 DIAGNOSIS — H52223 Regular astigmatism, bilateral: Secondary | ICD-10-CM | POA: Diagnosis not present

## 2023-02-01 DIAGNOSIS — H524 Presbyopia: Secondary | ICD-10-CM | POA: Diagnosis not present

## 2023-02-03 DIAGNOSIS — R498 Other voice and resonance disorders: Secondary | ICD-10-CM | POA: Diagnosis not present

## 2023-02-03 DIAGNOSIS — J385 Laryngeal spasm: Secondary | ICD-10-CM | POA: Diagnosis not present

## 2023-02-03 DIAGNOSIS — R490 Dysphonia: Secondary | ICD-10-CM | POA: Diagnosis not present

## 2023-04-19 DIAGNOSIS — Z1231 Encounter for screening mammogram for malignant neoplasm of breast: Secondary | ICD-10-CM | POA: Diagnosis not present

## 2023-04-19 LAB — HM MAMMOGRAPHY

## 2023-04-20 ENCOUNTER — Encounter: Payer: Self-pay | Admitting: Family

## 2023-04-26 ENCOUNTER — Encounter: Payer: Self-pay | Admitting: Family

## 2023-05-09 DIAGNOSIS — H524 Presbyopia: Secondary | ICD-10-CM | POA: Diagnosis not present

## 2023-05-12 DIAGNOSIS — R251 Tremor, unspecified: Secondary | ICD-10-CM | POA: Diagnosis not present

## 2023-05-12 DIAGNOSIS — R498 Other voice and resonance disorders: Secondary | ICD-10-CM | POA: Diagnosis not present

## 2023-05-12 DIAGNOSIS — F419 Anxiety disorder, unspecified: Secondary | ICD-10-CM | POA: Diagnosis not present

## 2023-05-12 DIAGNOSIS — Z79899 Other long term (current) drug therapy: Secondary | ICD-10-CM | POA: Diagnosis not present

## 2023-05-12 DIAGNOSIS — F064 Anxiety disorder due to known physiological condition: Secondary | ICD-10-CM | POA: Diagnosis not present

## 2023-08-11 DIAGNOSIS — R490 Dysphonia: Secondary | ICD-10-CM | POA: Diagnosis not present

## 2023-08-11 DIAGNOSIS — R498 Other voice and resonance disorders: Secondary | ICD-10-CM | POA: Diagnosis not present

## 2023-08-11 DIAGNOSIS — J385 Laryngeal spasm: Secondary | ICD-10-CM | POA: Diagnosis not present

## 2024-01-26 DIAGNOSIS — J385 Laryngeal spasm: Secondary | ICD-10-CM | POA: Diagnosis not present

## 2024-01-26 DIAGNOSIS — R490 Dysphonia: Secondary | ICD-10-CM | POA: Diagnosis not present

## 2024-01-26 DIAGNOSIS — R498 Other voice and resonance disorders: Secondary | ICD-10-CM | POA: Diagnosis not present

## 2024-01-31 ENCOUNTER — Encounter: Payer: Self-pay | Admitting: Gastroenterology

## 2024-02-01 ENCOUNTER — Ambulatory Visit: Payer: Self-pay

## 2024-02-01 NOTE — Telephone Encounter (Signed)
 FYI Only or Action Required?: FYI only for provider: recommended to Urgent Care.  Patient was last seen in primary care on 03/30/2022 by Daryl Setter, NP.  Called Nurse Triage reporting Hypertension.  Symptoms began several days ago.  Interventions attempted: Rest, hydration, or home remedies.  Symptoms are: unchanged.  Triage Disposition: See PCP Within 2 Weeks  Patient/caregiver understands and will follow disposition?: Yes  Copied from CRM [id number]. Topic: Clinical - Red Word Triage >> Feb 01, 2024  8:41 AM Cleave MATSU wrote: Red Word that prompted transfer to Nurse Triage: pt said her blood pressure has been running high and she wants to get scheduled with kara clapp but she dont have any availability until jan Reason for Disposition  [1] Systolic BP >= 130 OR Diastolic >= 80 AND [2] not taking BP medications  Answer Assessment - Initial Assessment Questions Patient reports a high BP reading of 140/79. The reading was done last Friday when she was at the doctor's appointment. Patient has a vocal tremor that she gets botox for every couple of months. Patient reports wanting to transfer her care due to location. Patient is scheduled for a [facility] appointment in Jan 2026. Patient is recommended to monitor her blood pressure and have a recheck done at urgent care within the next two weeks.   1. BLOOD PRESSURE: What is your blood pressure? Did you take at least two measurements 5 minutes apart?     140/79 2. ONSET: When did you take your blood pressure?     Last Friday 3. HOW: How did you take your blood pressure? (e.g., automatic home BP monitor, visiting nurse)     Done at the doctor's office last Friday.  4. HISTORY: Do you have a history of high blood pressure?     No  5. MEDICINES: Are you taking any medicines for blood pressure? Have you missed any doses recently?     no 6. OTHER SYMPTOMS: Do you have any symptoms? (e.g., blurred vision, chest pain, difficulty  breathing, headache, weakness)     no  Protocols used: Blood Pressure - High-A-AH

## 2024-02-06 ENCOUNTER — Ambulatory Visit: Payer: Self-pay
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
## 2024-02-06 NOTE — Telephone Encounter (Signed)
 FYI Only or Action Required?: FYI only for provider: appointment scheduled on 02/07/24.  Patient was last seen in primary care on 03/30/2022 by Daryl Setter, NP.  Called Nurse Triage reporting Hypertension.  Symptoms began yesterday.  Interventions attempted: Rest, hydration, or home remedies.  Symptoms are: gradually improving.  Triage Disposition: See PCP When Office is Open (Within 3 Days)  Patient/caregiver understands and will follow disposition?: Yes    Copied from CRM #[id number]. Topic: Clinical - Red Word Triage >> Feb 06, 2024  9:45 AM Charolett L wrote: [facility] that prompted transfer to Nurse Triage: Pressure is higher than normal 162/96 Reason for Disposition  Systolic BP >= 160 OR Diastolic >= 100  Answer Assessment - Initial Assessment Questions Requesting sdv or visit tomorrow, anxious with new high blood pressure. Scheduled acute with alternate provider on 02/07/24.     1. BLOOD PRESSURE: What is your blood pressure? Did you take at least two measurements 5 minutes apart?     Current 145/82, yesterday evening 162/96 2. ONSET: When did you take your blood pressure?     today 3. HOW: How did you take your blood pressure? (e.g., automatic home BP monitor, visiting nurse)     Home monitor 4. HISTORY: Do you have a history of high blood pressure?     About 1.5 years ago but never needed medication 5. MEDICINES: Are you taking any medicines for blood pressure? Have you missed any doses recently?     no 6. OTHER SYMPTOMS: Do you have any symptoms? (e.g., blurred vision, chest pain, difficulty breathing, headache, weakness)     Denies  7. PREGNANCY: Is there any chance you are pregnant? When was your last menstrual period?  Protocols used: Blood Pressure - High-A-AH

## 2024-02-07 ENCOUNTER — Encounter: Payer: Self-pay | Admitting: Family Medicine

## 2024-02-07 ENCOUNTER — Ambulatory Visit: Admitting: Family Medicine

## 2024-02-07 VITALS — BP 140/94 | HR 72 | Temp 98.4°F | Ht 65.0 in | Wt 192.2 lb

## 2024-02-07 DIAGNOSIS — M25552 Pain in left hip: Secondary | ICD-10-CM

## 2024-02-07 DIAGNOSIS — Z23 Encounter for immunization: Secondary | ICD-10-CM | POA: Diagnosis not present

## 2024-02-07 DIAGNOSIS — R9431 Abnormal electrocardiogram [ECG] [EKG]: Secondary | ICD-10-CM

## 2024-02-07 DIAGNOSIS — Z1329 Encounter for screening for other suspected endocrine disorder: Secondary | ICD-10-CM

## 2024-02-07 DIAGNOSIS — Z131 Encounter for screening for diabetes mellitus: Secondary | ICD-10-CM

## 2024-02-07 DIAGNOSIS — I10 Essential (primary) hypertension: Secondary | ICD-10-CM | POA: Diagnosis not present

## 2024-02-07 DIAGNOSIS — Z13220 Encounter for screening for lipoid disorders: Secondary | ICD-10-CM

## 2024-02-07 LAB — LIPID PANEL
Cholesterol: 164 mg/dL (ref 0–200)
HDL: 52.2 mg/dL (ref >39.00)
LDL Cholesterol: 90 mg/dL (ref 0–99)
NonHDL: 111.58
Total CHOL/HDL Ratio: 3
Triglycerides: 106 mg/dL (ref 0.0–149.0)
VLDL: 21.2 mg/dL (ref 0.0–40.0)

## 2024-02-07 LAB — COMPREHENSIVE METABOLIC PANEL WITH GFR
ALT: 20 U/L (ref 0–35)
AST: 15 U/L (ref 0–37)
Albumin: 4.3 g/dL (ref 3.5–5.2)
Alkaline Phosphatase: 96 U/L (ref 39–117)
BUN: 18 mg/dL (ref 6–23)
CO2: 29 meq/L (ref 19–32)
Calcium: 9.2 mg/dL (ref 8.4–10.5)
Chloride: 104 meq/L (ref 96–112)
Creatinine, Ser: 0.8 mg/dL (ref 0.40–1.20)
GFR: 76.66 mL/min (ref >60.00)
Glucose, Bld: 94 mg/dL (ref 70–99)
Potassium: 4.3 meq/L (ref 3.5–5.1)
Sodium: 139 meq/L (ref 135–145)
Total Bilirubin: 0.6 mg/dL (ref 0.2–1.2)
Total Protein: 6.6 g/dL (ref 6.0–8.3)

## 2024-02-07 LAB — HEMOGLOBIN A1C: Hgb A1c MFr Bld: 5.5 % (ref 4.6–6.5)

## 2024-02-07 LAB — CBC
HCT: 43.5 % (ref 36.0–46.0)
Hemoglobin: 14.6 g/dL (ref 12.0–15.0)
MCHC: 33.6 g/dL (ref 30.0–36.0)
MCV: 91.1 fl (ref 78.0–100.0)
Platelets: 324 K/uL (ref 150.0–400.0)
RBC: 4.78 Mil/uL (ref 3.87–5.11)
RDW: 13.3 % (ref 11.5–15.5)
WBC: 5.5 K/uL (ref 4.0–10.5)

## 2024-02-07 LAB — TSH: TSH: 1.71 u[IU]/mL (ref 0.35–5.50)

## 2024-02-07 MED ORDER — LOSARTAN POTASSIUM 25 MG PO TABS: 25.0000 mg | ORAL_TABLET | Freq: Every day | ORAL | 1 refills | Status: DC

## 2024-02-07 NOTE — Patient Instructions (Addendum)
 Good to see you today- I will be in touch with your labs asap We will set you up for an echocardiogram to get a better look at your heart  Start on losartan  25 mg - if your BP is not coming under about 140/85 in 4-5 days you can go up to 50 mg   [facility] Address: [address], [place], [place] [address] Phone: [phone]

## 2024-02-21 DIAGNOSIS — M5432 Sciatica, left side: Secondary | ICD-10-CM | POA: Diagnosis not present

## 2024-02-21 DIAGNOSIS — M4726 Other spondylosis with radiculopathy, lumbar region: Secondary | ICD-10-CM | POA: Diagnosis not present

## 2024-03-18 NOTE — Progress Notes (Signed)
 "  Chief Complaint: Rectal bleeding Primary GI MD: Dr. Eda (prefers female providers)  HPI: Discussed the use of AI scribe software for clinical note transcription with the patient, who gave verbal consent to proceed.  History of Present Illness   Jennifer [name] is a 69 year old female who presents with rectal bleeding.  Rectal bleeding occurs about once per week, primarily noted on tissue paper and associated with straining during bowel movements. She is unable to determine if blood is present in the toilet. Occasional itching is reported, without significant pain. She sometimes feels a small protrusion, which she suspects is a hemorrhoid.  This bleeding mainly occurs when she has to strain with bowel movements  She takes a fiber supplement, which has helped reduce straining, and her stools are usually soft. She attempts to drink five 16-ounce bottles of water daily, though she does not always meet this goal.  Colonoscopy within the last five years was normal, and a prior colonoscopy was also unremarkable. No history of polyps.   PREVIOUS GI WORKUP   Colonoscopy 10/11/2018 - Melanosis in the colon.  - One < 1 mm polyp in the cecum, removed with a cold biopsy forceps. Resected and retrieved.  - The examination was otherwise normal on direct and retroflexion views.  Surgical [P], colon, cecum, polyp - POLYPOID COLONIC MUCOSA WITH MELANOSIS COLI. - NO ADENOMATOUS CHANGE OR MALIGNANCY  Past Medical History:  Diagnosis Date   ANA positive    Anemia    Hearing loss 1962   left ear- due to measles   Thrombosis of retinal vein of left eye (HCC) 1990   was on OCP at the time    Past Surgical History:  Procedure Laterality Date   BLADDER SUSPENSION  2005   CESAREAN SECTION  1993   COLONOSCOPY     TOTAL VAGINAL HYSTERECTOMY  2005   post repair/sling   TUBAL LIGATION  2004    Current Outpatient Medications  Medication Sig Dispense Refill   diazepam  (VALIUM ) 5 MG tablet 1  tab 1 hour prior to procedure; may repeat 30 min prior prn 3 tablet 0   hydrocortisone  (ANUSOL -HC) 2.5 % rectal cream Place 1 Application rectally 2 (two) times daily. 30 g 1   Lactobacillus (PROBACAP) CAPS Take by mouth.     losartan  (COZAAR ) 25 MG tablet Take 1 tablet (25 mg total) by mouth daily. 90 tablet 1   propranolol (INDERAL) 10 MG tablet Take 10 mg by mouth 2 (two) times daily.     Vitamin D , Ergocalciferol , (DRISDOL ) 1.25 MG (50000 UNIT) CAPS capsule Take 1 capsule (50,000 Units total) by mouth every 7 (seven) days. 12 capsule 0   sertraline  (ZOLOFT ) 50 MG tablet 1/2 tab by mouth once daily for 1 month, may increase to a full tab once daily on week two (Patient not taking: Reported on 03/19/2024) 30 tablet 0   No current facility-administered medications for this visit.    Allergies as of 03/19/2024 - Review Complete 03/19/2024  Allergen Reaction Noted   Codeine  04/23/2008    Family History  Problem Relation Age of Onset   Colon polyps Mother    Heart disease Father        AFib, MVP, borderline diabetes   Hypertension Father    CVA Father    Diabetes Mellitus II Father    Parkinson's disease Paternal Grandfather    Colon cancer Neg Hx    Esophageal cancer Neg Hx    Rectal cancer Neg  Hx    Stomach cancer Neg Hx     Social History   Socioeconomic History   Marital status: Divorced    Spouse name: Not on file   Number of children: 2   Years of education: Not on file   Highest education level: Not on file  Occupational History   Occupation: Biomedical engineer   Occupation: advertising account planner    Comment: 3 days per week  Tobacco Use   Smoking status: Never   Smokeless tobacco: Never  Vaping Use   Vaping status: Never Used  Substance and Sexual Activity   Alcohol use: Yes    Comment: every 2-3 weeks, glass of wine   Drug use: No   Sexual activity: Yes    Partners: Male    Birth control/protection: Surgical  Other Topics Concern   Not on file  Social History  Narrative   Divorced in 2000   2 grown children- son and daughter- both local   Has a boyfriend of 10 years    Works as an biomedical engineer   [place]   Kayaking, walking, restaurants   No pets   Right handed   Work advertising account planner    Caffeine 1 cup a day   Social Drivers of Health   Tobacco Use: Low Risk (03/19/2024)   Patient History    Smoking Tobacco Use: Never    Smokeless Tobacco Use: Never    Passive Exposure: Not on file  Financial Resource Strain: Not on file  Food Insecurity: Not on file  Transportation Needs: Not on file  Physical Activity: Not on file  Stress: Not on file  Social Connections: Not on file  Intimate Partner Violence: Not on file  Depression (PHQ2-9): Low Risk (02/07/2024)   Depression (PHQ2-9)    PHQ-2 Score: 4  Alcohol Screen: Not on file  Housing: Not on file  Utilities: Not on file  Health Literacy: Not on file    Review of Systems:    Constitutional: No weight loss, fever, chills, weakness or fatigue HEENT: Eyes: No change in vision               Ears, Nose, Throat:  No change in hearing or congestion Skin: No rash or itching Cardiovascular: No chest pain, chest pressure or palpitations   Respiratory: No SOB or cough Gastrointestinal: See HPI and otherwise negative Genitourinary: No dysuria or change in urinary frequency Neurological: No headache, dizziness or syncope Musculoskeletal: No new muscle or joint pain Hematologic: No bleeding or bruising Psychiatric: No history of depression or anxiety    Physical Exam:  Vital signs: BP 124/78   Pulse 69   Ht 5' 5 (1.651 m)   Wt 196 lb (88.9 kg)   BMI 32.62 kg/m   Constitutional: NAD, alert and cooperative Head:  Normocephalic and atraumatic. Eyes:   PEERL, EOMI. No icterus. Conjunctiva pink. Respiratory: Respirations even and unlabored. Lungs clear to auscultation bilaterally.   No wheezes, crackles, or rhonchi.  Cardiovascular:  Regular rate and  rhythm. No peripheral edema, cyanosis or pallor.  Gastrointestinal:  Soft, nondistended, nontender. No rebound or guarding. Normal bowel sounds. No appreciable masses or hepatomegaly. Rectal: Sonny CMA chaperone.  No fissures, small external hemorrhoid.  Increased sphincter tone with presence of internal hemorrhoid appreciated on DRE.  Anoscopy deferred due to increased sphincter tone Msk:  Symmetrical without gross deformities. Without edema, no deformity or joint abnormality.  Neurologic:  Alert and  oriented x4;  grossly  normal neurologically.  Skin:   Dry and intact without significant lesions or rashes. Psychiatric: Oriented to person, place and time. Demonstrates good judgement and reason without abnormal affect or behaviors.  RELEVANT LABS AND IMAGING: CBC    Component Value Date/Time   WBC 5.5 02/07/2024 1030   RBC 4.78 02/07/2024 1030   HGB 14.6 02/07/2024 1030   HCT 43.5 02/07/2024 1030   PLT 324.0 02/07/2024 1030   MCV 91.1 02/07/2024 1030   MCH 31.0 10/23/2019 1502   MCHC 33.6 02/07/2024 1030   RDW 13.3 02/07/2024 1030   LYMPHSABS 1,747 10/23/2019 1502   MONOABS 0.5 07/18/2012 1319   EOSABS 192 10/23/2019 1502   BASOSABS 58 10/23/2019 1502    CMP     Component Value Date/Time   NA 139 02/07/2024 1030   K 4.3 02/07/2024 1030   CL 104 02/07/2024 1030   CO2 29 02/07/2024 1030   GLUCOSE 94 02/07/2024 1030   BUN 18 02/07/2024 1030   CREATININE 0.80 02/07/2024 1030   CREATININE 0.76 10/23/2019 1502   CALCIUM 9.2 02/07/2024 1030   PROT 6.6 02/07/2024 1030   ALBUMIN 4.3 02/07/2024 1030   AST 15 02/07/2024 1030   ALT 20 02/07/2024 1030   ALKPHOS 96 02/07/2024 1030   BILITOT 0.6 02/07/2024 1030     Assessment/Plan:   Rectal bleeding Rectal itching Occasional rectal bleeding once per week on the tissue paper after straining with a bowel movement.  Overall normal bowel movements.  Rectal exam showed internal hemorrhoid with increased sphincter tone.  Recent  reassuring previous colonoscopy with no previous polyp history, no weight loss, no change in bowel habits. - Continue fiber supplement - Continue increased water and increase exercise - Hydrocortisone  suppositories twice daily for 14 days - Squatty potty to help with pelvic floor - Follow-up 6 to 8 weeks  Colon cancer screening Colonoscopy 09/2018 with 10-year recall - Repeat 09/2028  Vocal tremor Dysphonia Laryngeal spasm Follows with ENT  Assign to Dr. Federico today (requesting female)  Nestor Blower, PA-C [facility] 03/19/2024, 10:16 AM  Cc: Daryl Setter, NP "

## 2024-03-19 ENCOUNTER — Encounter: Payer: Self-pay | Admitting: Gastroenterology

## 2024-03-19 ENCOUNTER — Ambulatory Visit: Admitting: Gastroenterology

## 2024-03-19 VITALS — BP 124/78 | HR 69 | Ht 65.0 in | Wt 196.0 lb

## 2024-03-19 DIAGNOSIS — K648 Other hemorrhoids: Secondary | ICD-10-CM

## 2024-03-19 DIAGNOSIS — K625 Hemorrhage of anus and rectum: Secondary | ICD-10-CM

## 2024-03-19 DIAGNOSIS — Z1211 Encounter for screening for malignant neoplasm of colon: Secondary | ICD-10-CM

## 2024-03-19 DIAGNOSIS — L290 Pruritus ani: Secondary | ICD-10-CM

## 2024-03-19 DIAGNOSIS — K6289 Other specified diseases of anus and rectum: Secondary | ICD-10-CM | POA: Diagnosis not present

## 2024-03-19 DIAGNOSIS — K649 Unspecified hemorrhoids: Secondary | ICD-10-CM

## 2024-03-19 MED ORDER — HYDROCORTISONE (PERIANAL) 2.5 % EX CREA: 1.0000 | TOPICAL_CREAM | Freq: Two times a day (BID) | CUTANEOUS | 1 refills | Status: AC

## 2024-03-19 NOTE — Progress Notes (Signed)
 I agree with the assessment and plan as outlined by Ms. McMichael.

## 2024-03-19 NOTE — Patient Instructions (Addendum)
 Squatty potty  We have sent the following medications to your pharmacy for you to pick up at your convenience: Anusol  HC cream.  Purchase Preparation H suppositories over the counter.  Apply a pea sized amount of the Anusol  cream to a suppository and insert rectally.   _______________________________________________________  If your blood pressure at your visit was 140/90 or greater, please contact your primary care physician to follow up on this.  _______________________________________________________  If you are age 67 or older, your body mass index should be between 23-30. Your Body mass index is 32.62 kg/m. If this is out of the aforementioned range listed, please consider follow up with your Primary Care Provider.  If you are age 87 or younger, your body mass index should be between 19-25. Your Body mass index is 32.62 kg/m. If this is out of the aformentioned range listed, please consider follow up with your Primary Care Provider.   ________________________________________________________  The [facility] providers would like to encourage you to use MYCHART to communicate with providers for non-urgent requests or questions.  Due to long hold times on the telephone, sending your provider a message by [facility] may be a faster and more efficient way to get a response.  Please allow 48 business hours for a response.  Please remember that this is for non-urgent requests.  _______________________________________________________  [facility] is using a team-based approach to care.  Your team is made up of your doctor and two to three APPS. Our APPS (Nurse Practitioners and Physician Assistants) work with your physician to ensure care continuity for you. They are fully qualified to address your health concerns and develop a treatment plan. They communicate directly with your gastroenterologist to care for you. Seeing the Advanced Practice Practitioners on your physician's team can  help you by facilitating care more promptly, often allowing for earlier appointments, access to diagnostic testing, procedures, and other specialty referrals.  \

## 2024-03-21 ENCOUNTER — Encounter

## 2024-03-27 ENCOUNTER — Other Ambulatory Visit (INDEPENDENT_AMBULATORY_CARE_PROVIDER_SITE_OTHER)

## 2024-03-27 DIAGNOSIS — I10 Essential (primary) hypertension: Secondary | ICD-10-CM

## 2024-03-27 DIAGNOSIS — R9431 Abnormal electrocardiogram [ECG] [EKG]: Secondary | ICD-10-CM

## 2024-03-27 LAB — ECHOCARDIOGRAM COMPLETE
Area-P 1/2: 3.21 cm2
S' Lateral: 2.64 cm

## 2024-03-28 ENCOUNTER — Encounter: Payer: Self-pay | Admitting: Family Medicine

## 2024-03-28 DIAGNOSIS — R9431 Abnormal electrocardiogram [ECG] [EKG]: Secondary | ICD-10-CM

## 2024-03-28 DIAGNOSIS — I10 Essential (primary) hypertension: Secondary | ICD-10-CM

## 2024-04-02 ENCOUNTER — Other Ambulatory Visit (HOSPITAL_BASED_OUTPATIENT_CLINIC_OR_DEPARTMENT_OTHER): Payer: Self-pay

## 2024-04-02 ENCOUNTER — Ambulatory Visit: Admitting: Physician Assistant

## 2024-04-02 VITALS — BP 140/100 | HR 74 | Ht 65.0 in | Wt 194.0 lb

## 2024-04-02 DIAGNOSIS — I447 Left bundle-branch block, unspecified: Secondary | ICD-10-CM

## 2024-04-02 DIAGNOSIS — R030 Elevated blood-pressure reading, without diagnosis of hypertension: Secondary | ICD-10-CM

## 2024-04-02 DIAGNOSIS — R9431 Abnormal electrocardiogram [ECG] [EKG]: Secondary | ICD-10-CM

## 2024-04-02 DIAGNOSIS — R0602 Shortness of breath: Secondary | ICD-10-CM | POA: Diagnosis not present

## 2024-04-02 MED ORDER — METOPROLOL TARTRATE 100 MG PO TABS
100.0000 mg | ORAL_TABLET | Freq: Once | ORAL | 0 refills | Status: DC
  Filled 2024-04-02: qty 1, 1d supply, fill #0

## 2024-04-02 MED ORDER — LOSARTAN POTASSIUM 100 MG PO TABS: 100.0000 mg | ORAL_TABLET | Freq: Every day | ORAL | 5 refills | Status: DC

## 2024-04-02 NOTE — Progress Notes (Unsigned)
 " Cardiology Office Note   Date:  04/04/2024  ID:  Estalee, Mccandlish [birth year], MRN [id number] PCP: Daryl Setter, NP  [facility] Providers Cardiologist:  [facility]  History of Present Illness Jennifer [name] is a 69 y.o. female with past medical history of hypertension.  She previously underwent testing for ALS and Parkinson's disease due to voice tremor in 2022.  She has family history of hypertension.  More recently, patient established with Dr. Koplan in November 2025 at which time her blood pressure has been elevated.  EKG at the time showed bundle branch block with possible left atrial enlargement.  Echocardiogram was recommended.  For her elevated blood pressure, she was started on 25 mg of losartan .  She was also referred to orthopedic service for left hip pain.  Recent blood work showed hemoglobin A1c of 5.5, TSH 1.7.  Well-controlled cholesterol.  Normal CMP and CBC.  Echocardiogram obtained on 03/27/2024 showed EF 50 to 55%, no regional wall motion abnormality, grade 1 DD, no significant valve issue.  More recently, patient has been seen by GI service for rectal bleeding, it was suspected rectal bleeding is from straining during bowel movement and internal hemorrhoids.  [facility] today for evaluation of abnormal ECG and elevated blood pressure.  Blood pressure has been in the 130s after she increased losartan  to 50 mg daily.  On manual recheck by myself, blood pressure remained elevated at 144/90.  I will increase her losartan  to 100 mg daily.  I asked her to keep a blood pressure diary, if systolic blood pressure remain elevated in the next 2 weeks, she is going to contact me through MyChart and I will change her losartan  to something stronger such as olmesartan 20 mg daily.  She is also on low-dose propranolol (for anxiety?).  I reviewed the recent echocardiogram with the patient.  Her ejection fraction is near the lower border normal.   Talking with the patient, she denies any chest pain, she does complain of dyspnea on exertion that seems to be chronic.  With a newly diagnosed a left bundle branch block and dyspnea on exertion, I recommend a coronary CT to rule out underlying coronary artery disease.  Her father has history of heart disease, however she herself has never been diagnosed with underlying CAD.  In the morning of the coronary CT, she will take a single dose of metoprolol  100 mg tablet instead of her regular losartan  and propranolol.  If coronary CT is reassuring, she can follow-up with cardiology service as needed.  Otherwise, her lung is clear, she has no lower extremity edema, orthopnea or PND, she does not have any heart failure symptoms.  ROS:   Patient has dyspnea on exertion but no chest pain.  She has no lower extremity edema, orthopnea or PND.  Studies Reviewed EKG Interpretation Date/Time:  Tuesday April 02 2024 09:19:58 EST Ventricular Rate:  66 PR Interval:  176 QRS Duration:  130 QT Interval:  420 QTC Calculation: 440 R Axis:   -60  Text Interpretation: Normal sinus rhythm Possible Left atrial enlargement Left axis deviation Left bundle branch block No previous ECGs available Confirmed by Janene Boer [phone]) on 04/04/2024 6:59:21 PM    Cardiac Studies & Procedures   ______________________________________________________________________________________________     ECHOCARDIOGRAM  ECHOCARDIOGRAM COMPLETE 03/27/2024  Narrative ECHOCARDIOGRAM REPORT    Patient Name:   Jennifer [name] Date of Exam: 03/27/2024 Medical Rec #:  [id number]  Height:       65.0 in Accession #:    [id number]      Weight:       196.0 lb Date of Birth:  [birth year]        BSA:          1.960 m Patient Age:    68 years        BP:           128/78 mmHg Patient Gender: F               HR:           64 bpm. Exam Location:  Outpatient  Procedure: 2D Echo and 3D Echo (Both Spectral and Color Flow Doppler were utilized  during procedure).  Indications:    Abnormal EKG  History:        Patient has no prior history of Echocardiogram examinations. Abnormal ECG; Risk Factors:Non-Smoker and Hypertension. EKG shows bundle branch block and possible left atrial enlargement.  Sonographer:    Orvil Holmes RDCS Referring Phys: [phone] JESSICA C COPLAND  IMPRESSIONS   1. Left ventricular ejection fraction, by estimation, is 50 to 55%. The left ventricle has low normal function. The left ventricle has no regional wall motion abnormalities. Left ventricular diastolic parameters are consistent with Grade I diastolic dysfunction (impaired relaxation). The average left ventricular global longitudinal strain is -14.6 %. The global longitudinal strain is abnormal. 2. Right ventricular systolic function is normal. The right ventricular size is normal. 3. The mitral valve is normal in structure. No evidence of mitral valve regurgitation. No evidence of mitral stenosis. 4. The aortic valve is normal in structure. Aortic valve regurgitation is not visualized. No aortic stenosis is present. 5. The inferior vena cava is normal in size with greater than 50% respiratory variability, suggesting right atrial pressure of 3 mmHg.  Comparison(s): No prior Echocardiogram.  FINDINGS Left Ventricle: Left ventricular ejection fraction, by estimation, is 50 to 55%. The left ventricle has low normal function. The left ventricle has no regional wall motion abnormalities. The average left ventricular global longitudinal strain is -14.6 %. Strain was performed and the global longitudinal strain is abnormal. 3D ejection fraction reviewed and evaluated as part of the interpretation. Alternate measurement of EF is felt to be most reflective of LV function. The left ventricular internal cavity size was normal in size. There is no left ventricular hypertrophy. Left ventricular diastolic parameters are consistent with Grade I diastolic dysfunction  (impaired relaxation).  Right Ventricle: The right ventricular size is normal. No increase in right ventricular wall thickness. Right ventricular systolic function is normal.  Left Atrium: Left atrial size was normal in size.  Right Atrium: Right atrial size was normal in size.  Pericardium: There is no evidence of pericardial effusion.  Mitral Valve: The mitral valve is normal in structure. No evidence of mitral valve regurgitation. No evidence of mitral valve stenosis.  Tricuspid Valve: The tricuspid valve is normal in structure. Tricuspid valve regurgitation is not demonstrated. No evidence of tricuspid stenosis.  Aortic Valve: The aortic valve is normal in structure. Aortic valve regurgitation is not visualized. No aortic stenosis is present.  Pulmonic Valve: The pulmonic valve was not well visualized. Pulmonic valve regurgitation is not visualized. No evidence of pulmonic stenosis.  Aorta: The aortic root is normal in size and structure.  Venous: The inferior vena cava is normal in size with greater than 50% respiratory variability, suggesting right atrial pressure of 3 mmHg.  IAS/Shunts:  No atrial level shunt detected by color flow Doppler.  Additional Comments: 3D was performed not requiring image post processing on an independent workstation and was normal.   LEFT VENTRICLE PLAX 2D LVIDd:         3.54 cm   Diastology LVIDs:         2.64 cm   LV e' medial:    5.98 cm/s LV PW:         1.82 cm   LV E/e' medial:  9.5 LV IVS:        0.99 cm   LV e' lateral:   4.13 cm/s LVOT diam:     2.10 cm   LV E/e' lateral: 13.7 LV SV:         55 LV SV Index:   28        2D Longitudinal Strain LVOT Area:     3.46 cm  2D Strain GLS (A4C):   -14.7 % 2D Strain GLS (A3C):   -12.6 % 2D Strain GLS (A2C):   -16.4 % 2D Strain GLS Avg:     -14.6 %  3D Volume EF: 3D EF:        58 % LV EDV:       101 ml LV ESV:       42 ml LV SV:        58 ml  RIGHT VENTRICLE RV Basal diam:  3.07 cm RV  Mid diam:    2.19 cm RV S prime:     12.90 cm/s TAPSE (M-mode): 2.8 cm  LEFT ATRIUM             Index        RIGHT ATRIUM          Index LA diam:        3.80 cm 1.94 cm/m   RA Area:     8.72 cm LA Vol (A2C):   29.1 ml 14.84 ml/m  RA Volume:   17.40 ml 8.88 ml/m LA Vol (A4C):   24.8 ml 12.65 ml/m LA Biplane Vol: 28.8 ml 14.69 ml/m AORTIC VALVE LVOT Vmax:   80.70 cm/s LVOT Vmean:  51.900 cm/s LVOT VTI:    0.158 m  AORTA Ao Root diam: 2.70 cm Ao Asc diam:  2.60 cm  MITRAL VALVE               TRICUSPID VALVE MV Area (PHT): 3.21 cm    TR Peak grad:   23.4 mmHg MV Decel Time: 236 msec    TR Vmax:        242.00 cm/s MV E velocity: 56.60 cm/s MV A velocity: 85.40 cm/s  SHUNTS MV E/A ratio:  0.66        Systemic VTI:  0.16 m Systemic Diam: 2.10 cm  Joelle Azobou Tonleu Electronically signed by Joelle Cedars Tonleu Signature Date/Time: 03/27/2024/1:39:47 PM    Final          ______________________________________________________________________________________________      Risk Assessment/Calculations          Physical Exam VS:  BP (!) 140/100   Pulse 74   Ht 5' 5 (1.651 m)   Wt 194 lb 0.6 oz (88 kg)   SpO2 95%   BMI 32.29 kg/m        Wt Readings from Last 3 Encounters:  04/02/24 194 lb 0.6 oz (88 kg)  03/19/24 196 lb (88.9 kg)  02/07/24 192 lb 3.2 oz (87.2 kg)    GEN: Well nourished,  well developed in no acute distress NECK: No JVD; No carotid bruits CARDIAC: RRR, no murmurs, rubs, gallops RESPIRATORY:  Clear to auscultation without rales, wheezing or rhonchi  ABDOMEN: Soft, non-tender, non-distended EXTREMITIES:  No edema; No deformity   ASSESSMENT AND PLAN  Abnormal ECG: Patient has a left bundle branch block on ECG of unknown duration.  She has dyspnea on exertion but no chest pain.  I recommend a coronary CTA to rule out underlying coronary artery disease.   Elevated blood pressure: Even on manual recheck by myself, blood pressure continued to  be elevated 144/90, I will increase her losartan  to 100 mg daily.  If blood pressure remain elevated in the next 2 weeks, patient has been encouraged to send me a MyChart message.  If that is the case I likely will switch losartan  to olmesartan for better blood pressure control.       Dispo: If coronary CT is reassuring, she can follow-up with cardiology service as needed.  Signed, Scot Ford, PA  "

## 2024-04-02 NOTE — Patient Instructions (Addendum)
 Medication Instructions:  Your physician has recommended you make the following change in your medication:   INCREASE Losartan  to 100 mg taking 1 daily (new prescription has been sent to [facility])  *If you need a refill on your cardiac medications before your next appointment, please call your pharmacy*  Lab Work: TODAY:  BMET  If you have labs (blood work) drawn today and your tests are completely normal, you will receive your results only by: MyChart Message (if you have MyChart) OR A paper copy in the mail If you have any lab test that is abnormal or we need to change your treatment, we will call you to review the results.  Testing/Procedures: Your physician has requested that you have cardiac CT. Cardiac computed tomography (CT) is a painless test that uses an x-ray machine to take clear, detailed pictures of your heart. For further information please visit [email or web address] Please follow instruction sheet BELOW:    Your cardiac CT has been scheduled at    [facility] [address]  [place], [place] [address]  04/10/24 arriving at 12:00   If scheduled at the Heart and Vascular Tower at [facility], please enter the parking lot using the [address] entrance and use the FREE valet service at the patient drop-off area. Enter the building and check-in with registration on the main floor.   Please follow these instructions carefully (unless otherwise directed):  An IV will be required for this test and Nitroglycerin will be given.    On the Night Before the Test: Be sure to Drink plenty of water. Do not consume any caffeinated/decaffeinated beverages or chocolate 12 hours prior to your test. Do not take any antihistamines 12 hours prior to your test.  On the Day of the Test: Drink plenty of water until 1 hour prior to the test. Do not eat any food 1 hour prior to test. HOLD LOSARTAN  AND PROPRANOLOL ON THE DAY OF THE CARDIAC CT   Take metoprolol  100 MG (Lopressor ) two hours prior to test. THIS HAS BEEN SENT TO OUR PHARMACY ON THE 1ST FLOOR  FEMALES- please wear underwire-free bra if available, avoid dresses & tight clothing   After the Test: Drink plenty of water. After receiving IV contrast, you may experience a mild flushed feeling. This is normal. On occasion, you may experience a mild rash up to 24 hours after the test. This is not dangerous. If this occurs, you can take Benadryl  25 mg, Zyrtec, Claritin, or Allegra and increase your fluid intake. (Patients taking Tikosyn should avoid Benadryl , and may take Zyrtec, Claritin, or Allegra) If you experience trouble breathing, this can be serious. If it is severe call 911 IMMEDIATELY. If it is mild, please call our office.  We will call to schedule your test 2-4 weeks out understanding that some insurance companies will need an authorization prior to the service being performed.   For more information and frequently asked questions, please visit our website : [email or web address]  For non-scheduling related questions, please contact the cardiac imaging nurse navigator should you have any questions/concerns: Cardiac Imaging Nurse Navigators Direct Office Dial: [phone]   For scheduling needs, including cancellations and rescheduling, please call Brittany, [phone].   Follow-Up: At [facility], you and your health needs are our priority.  As part of our continuing mission to provide you with exceptional heart care, our providers are all part of one team.  This team includes your primary Cardiologist (physician) and Advanced Practice Providers  or APPs (Physician Assistants and Nurse Practitioners) who all work together to provide you with the care you need, when you need it.  Your next appointment:   DEPENDING UPON TEST RESULTS  Provider:   Scot Ford, PA-C          We recommend signing up for the patient portal called MyChart.  Sign  up information is provided on this After Visit Summary.  MyChart is used to connect with patients for Virtual Visits (Telemedicine).  Patients are able to view lab/test results, encounter notes, upcoming appointments, etc.  Non-urgent messages can be sent to your provider as well.   To learn more about what you can do with MyChart, go to [email or web address].   Other Instructions

## 2024-04-03 ENCOUNTER — Ambulatory Visit: Payer: Self-pay | Admitting: Physician Assistant

## 2024-04-03 LAB — BASIC METABOLIC PANEL WITH GFR
BUN/Creatinine Ratio: 14 (ref 12–28)
BUN: 13 mg/dL (ref 8–27)
CO2: 23 mmol/L (ref 20–29)
Calcium: 9.7 mg/dL (ref 8.7–10.3)
Chloride: 104 mmol/L (ref 96–106)
Creatinine, Ser: 0.92 mg/dL (ref 0.57–1.00)
Glucose: 90 mg/dL (ref 70–99)
Potassium: 4.8 mmol/L (ref 3.5–5.2)
Sodium: 142 mmol/L (ref 134–144)
eGFR: 69 mL/min/1.73

## 2024-04-03 NOTE — Progress Notes (Signed)
Stable renal function and electrolyte

## 2024-04-08 ENCOUNTER — Encounter (HOSPITAL_COMMUNITY): Payer: Self-pay

## 2024-04-10 ENCOUNTER — Ambulatory Visit (HOSPITAL_COMMUNITY)
Admission: RE | Admit: 2024-04-10 | Discharge: 2024-04-10 | Disposition: A | Discharge status: Home / Self Care | Source: Ambulatory Visit | Attending: Physician Assistant | Admitting: Physician Assistant

## 2024-04-10 DIAGNOSIS — R0602 Shortness of breath: Secondary | ICD-10-CM | POA: Diagnosis not present

## 2024-04-10 DIAGNOSIS — R030 Elevated blood-pressure reading, without diagnosis of hypertension: Secondary | ICD-10-CM | POA: Insufficient documentation

## 2024-04-10 DIAGNOSIS — I447 Left bundle-branch block, unspecified: Secondary | ICD-10-CM | POA: Diagnosis not present

## 2024-04-10 DIAGNOSIS — R9431 Abnormal electrocardiogram [ECG] [EKG]: Secondary | ICD-10-CM | POA: Insufficient documentation

## 2024-04-10 MED ORDER — IOHEXOL 350 MG/ML SOLN
100.0000 mL | Freq: Once | INTRAVENOUS | Status: AC | PRN
  Administered 2024-04-10: 100 mL via INTRAVENOUS

## 2024-04-10 MED ORDER — NITROGLYCERIN 0.4 MG SL SUBL
0.8000 mg | SUBLINGUAL_TABLET | Freq: Once | SUBLINGUAL | Status: AC
  Administered 2024-04-10: 0.8 mg via SUBLINGUAL

## 2024-04-11 NOTE — Progress Notes (Signed)
 I have called Jennifer [name] and discussed the results.  Overall minimal plaque.  Quite reassuring result.  Awaiting extracardiac portion to be read by radiologist.  She can follow-up with cardiology service as needed.  Once the extracardiac portion come back, I will call the patient again and discuss blood pressure control.  If systolic blood pressure remain elevated, may switch her losartan  to olmesartan.

## 2024-04-16 ENCOUNTER — Ambulatory Visit: Admitting: Family Medicine

## 2024-04-16 VITALS — BP 144/86 | HR 66 | Temp 97.0°F | Ht 65.0 in | Wt 194.0 lb

## 2024-04-16 DIAGNOSIS — I10 Essential (primary) hypertension: Secondary | ICD-10-CM

## 2024-04-16 DIAGNOSIS — Z23 Encounter for immunization: Secondary | ICD-10-CM

## 2024-04-16 DIAGNOSIS — R498 Other voice and resonance disorders: Secondary | ICD-10-CM | POA: Diagnosis not present

## 2024-04-16 DIAGNOSIS — K649 Unspecified hemorrhoids: Secondary | ICD-10-CM | POA: Insufficient documentation

## 2024-04-16 DIAGNOSIS — I447 Left bundle-branch block, unspecified: Secondary | ICD-10-CM | POA: Insufficient documentation

## 2024-04-16 DIAGNOSIS — Z0000 Encounter for general adult medical examination without abnormal findings: Secondary | ICD-10-CM

## 2024-04-16 DIAGNOSIS — Z13820 Encounter for screening for osteoporosis: Secondary | ICD-10-CM

## 2024-04-16 DIAGNOSIS — F411 Generalized anxiety disorder: Secondary | ICD-10-CM | POA: Diagnosis not present

## 2024-04-16 DIAGNOSIS — Z780 Asymptomatic menopausal state: Secondary | ICD-10-CM

## 2024-04-16 MED ORDER — CITALOPRAM HYDROBROMIDE 10 MG PO TABS: 10.0000 mg | ORAL_TABLET | Freq: Every day | ORAL | 3 refills | Status: AC

## 2024-04-16 MED ORDER — LOSARTAN POTASSIUM-HCTZ 100-12.5 MG PO TABS: 1.0000 | ORAL_TABLET | Freq: Every day | ORAL | 3 refills | Status: AC

## 2024-04-16 NOTE — Assessment & Plan Note (Signed)
 I will plan to start her on citalopram  10 mg daily. We will reassess sin 4 weeks.

## 2024-04-16 NOTE — Assessment & Plan Note (Signed)
 Blood pressure remains above goal. I recommend we switch her to losartan -HCTZ 100-12.5 mg daily. She should continue to monitor her BP at home and I will see her back in 1 month.

## 2024-05-15 ENCOUNTER — Ambulatory Visit: Admitting: Gastroenterology

## 2024-05-15 ENCOUNTER — Ambulatory Visit: Admitting: Family Medicine
